# Patient Record
Sex: Female | Born: 1960 | Race: White | Hispanic: No | Smoking: Former smoker
Health system: Southern US, Community
[De-identification: ages and names within clinical notes are randomized; demographics above are authoritative.]

## PROBLEM LIST (undated history)

## (undated) DIAGNOSIS — M1990 Unspecified osteoarthritis, unspecified site: Secondary | ICD-10-CM

## (undated) DIAGNOSIS — F319 Bipolar disorder, unspecified: Secondary | ICD-10-CM

## (undated) DIAGNOSIS — K219 Gastro-esophageal reflux disease without esophagitis: Secondary | ICD-10-CM

## (undated) DIAGNOSIS — Z87442 Personal history of urinary calculi: Secondary | ICD-10-CM

## (undated) DIAGNOSIS — F32A Depression, unspecified: Secondary | ICD-10-CM

## (undated) DIAGNOSIS — T7840XA Allergy, unspecified, initial encounter: Secondary | ICD-10-CM

## (undated) DIAGNOSIS — D689 Coagulation defect, unspecified: Secondary | ICD-10-CM

## (undated) DIAGNOSIS — F329 Major depressive disorder, single episode, unspecified: Secondary | ICD-10-CM

## (undated) DIAGNOSIS — J449 Chronic obstructive pulmonary disease, unspecified: Secondary | ICD-10-CM

## (undated) DIAGNOSIS — D649 Anemia, unspecified: Secondary | ICD-10-CM

## (undated) DIAGNOSIS — F419 Anxiety disorder, unspecified: Secondary | ICD-10-CM

## (undated) HISTORY — DX: Gastro-esophageal reflux disease without esophagitis: K21.9

## (undated) HISTORY — DX: Unspecified osteoarthritis, unspecified site: M19.90

## (undated) HISTORY — DX: Anxiety disorder, unspecified: F41.9

## (undated) HISTORY — DX: Major depressive disorder, single episode, unspecified: F32.9

## (undated) HISTORY — DX: Anemia, unspecified: D64.9

## (undated) HISTORY — DX: Allergy, unspecified, initial encounter: T78.40XA

## (undated) HISTORY — PX: HAND SURGERY: SHX662

## (undated) HISTORY — DX: Bipolar disorder, unspecified: F31.9

## (undated) HISTORY — DX: Coagulation defect, unspecified: D68.9

## (undated) HISTORY — DX: Depression, unspecified: F32.A

## (undated) HISTORY — DX: Chronic obstructive pulmonary disease, unspecified: J44.9

---

## 2000-08-29 ENCOUNTER — Encounter: Admission: RE | Admit: 2000-08-29 | Discharge: 2000-08-29 | Payer: Self-pay | Admitting: Occupational Medicine

## 2000-08-29 ENCOUNTER — Encounter: Payer: Self-pay | Admitting: Occupational Medicine

## 2003-08-29 HISTORY — PX: CHOLECYSTECTOMY: SHX55

## 2004-08-09 ENCOUNTER — Emergency Department (HOSPITAL_COMMUNITY): Admission: EM | Admit: 2004-08-09 | Discharge: 2004-08-09 | Payer: Self-pay | Admitting: *Deleted

## 2008-09-01 ENCOUNTER — Inpatient Hospital Stay (HOSPITAL_COMMUNITY): Admission: EM | Admit: 2008-09-01 | Discharge: 2008-09-03 | Payer: Self-pay | Admitting: Emergency Medicine

## 2008-09-01 ENCOUNTER — Ambulatory Visit: Payer: Self-pay | Admitting: Cardiovascular Disease

## 2008-09-02 ENCOUNTER — Encounter (INDEPENDENT_AMBULATORY_CARE_PROVIDER_SITE_OTHER): Payer: Self-pay | Admitting: Internal Medicine

## 2010-07-08 ENCOUNTER — Emergency Department (HOSPITAL_COMMUNITY)
Admission: EM | Admit: 2010-07-08 | Discharge: 2010-07-08 | Payer: Self-pay | Source: Home / Self Care | Admitting: Emergency Medicine

## 2010-07-25 ENCOUNTER — Encounter
Admission: RE | Admit: 2010-07-25 | Discharge: 2010-08-25 | Payer: Self-pay | Source: Home / Self Care | Attending: Orthopaedic Surgery | Admitting: Orthopaedic Surgery

## 2010-08-28 ENCOUNTER — Encounter: Admission: RE | Admit: 2010-08-28 | Payer: Self-pay | Source: Home / Self Care | Admitting: Orthopaedic Surgery

## 2010-12-12 LAB — CBC
HCT: 32.1 % — ABNORMAL LOW (ref 36.0–46.0)
HCT: 34.7 % — ABNORMAL LOW (ref 36.0–46.0)
MCV: 95.3 fL (ref 78.0–100.0)
Platelets: 174 10*3/uL (ref 150–400)
Platelets: 235 10*3/uL (ref 150–400)
Platelets: 237 10*3/uL (ref 150–400)
RDW: 11.7 % (ref 11.5–15.5)
RDW: 11.9 % (ref 11.5–15.5)
WBC: 6.6 10*3/uL (ref 4.0–10.5)
WBC: 7.4 10*3/uL (ref 4.0–10.5)

## 2010-12-12 LAB — DIFFERENTIAL
Basophils Absolute: 0.1 10*3/uL (ref 0.0–0.1)
Lymphocytes Relative: 26 % (ref 12–46)
Lymphs Abs: 1.9 10*3/uL (ref 0.7–4.0)
Monocytes Absolute: 0.4 10*3/uL (ref 0.1–1.0)
Monocytes Relative: 6 % (ref 3–12)
Neutro Abs: 4.9 10*3/uL (ref 1.7–7.7)

## 2010-12-12 LAB — POCT I-STAT, CHEM 8
BUN: 8 mg/dL (ref 6–23)
Calcium, Ion: 1.19 mmol/L (ref 1.12–1.32)
Chloride: 103 mEq/L (ref 96–112)
Creatinine, Ser: 0.9 mg/dL (ref 0.4–1.2)
Glucose, Bld: 98 mg/dL (ref 70–99)
HCT: 39 % (ref 36.0–46.0)
Hemoglobin: 13.3 g/dL (ref 12.0–15.0)
Potassium: 4.3 mEq/L (ref 3.5–5.1)
Sodium: 142 mEq/L (ref 135–145)
TCO2: 29 mmol/L (ref 0–100)

## 2010-12-12 LAB — TROPONIN I: Troponin I: <0.01 ng/mL (ref 0.00–0.06)

## 2010-12-12 LAB — BASIC METABOLIC PANEL
BUN: 4 mg/dL — ABNORMAL LOW (ref 6–23)
BUN: 8 mg/dL (ref 6–23)
CO2: 29 mEq/L (ref 19–32)
Chloride: 108 mEq/L (ref 96–112)
Chloride: 109 mEq/L (ref 96–112)
Glucose, Bld: 79 mg/dL (ref 70–99)
Potassium: 3.6 mEq/L (ref 3.5–5.1)
Potassium: 4 mEq/L (ref 3.5–5.1)

## 2010-12-12 LAB — CARDIAC PANEL(CRET KIN+CKTOT+MB+TROPI)
CK, MB: 0.4 ng/mL (ref 0.3–4.0)
CK, MB: 0.5 ng/mL (ref 0.3–4.0)
Relative Index: INVALID (ref 0.0–2.5)
Total CK: 38 U/L (ref 7–177)
Troponin I: <0.01 ng/mL (ref 0.00–0.06)
Troponin I: <0.01 ng/mL (ref 0.00–0.06)

## 2010-12-12 LAB — TSH: TSH: 0.652 u[IU]/mL (ref 0.350–4.500)

## 2010-12-12 LAB — RAPID URINE DRUG SCREEN, HOSP PERFORMED
Amphetamines: NOT DETECTED
Barbiturates: NOT DETECTED
Benzodiazepines: POSITIVE — AB
Cocaine: NOT DETECTED
Opiates: NOT DETECTED
Tetrahydrocannabinol: NOT DETECTED

## 2010-12-12 LAB — BRAIN NATRIURETIC PEPTIDE: Pro B Natriuretic peptide (BNP): <30 pg/mL (ref 0.0–100.0)

## 2010-12-12 LAB — COMPREHENSIVE METABOLIC PANEL
Albumin: 3.9 g/dL (ref 3.5–5.2)
BUN: 6 mg/dL (ref 6–23)
Calcium: 9 mg/dL (ref 8.4–10.5)
Creatinine, Ser: 0.67 mg/dL (ref 0.4–1.2)
GFR calc Af Amer: >60 mL/min (ref >60)
Total Bilirubin: 0.9 mg/dL (ref 0.3–1.2)
Total Protein: 6.7 g/dL (ref 6.0–8.3)

## 2010-12-12 LAB — CK TOTAL AND CKMB (NOT AT ARMC)
CK, MB: 0.5 ng/mL (ref 0.3–4.0)
Relative Index: INVALID (ref 0.0–2.5)
Total CK: 54 U/L (ref 7–177)

## 2010-12-12 LAB — POCT CARDIAC MARKERS
CKMB, poc: <1 ng/mL — ABNORMAL LOW (ref 1.0–8.0)
Myoglobin, poc: 26 ng/mL (ref 12–200)
Troponin i, poc: <0.05 ng/mL (ref 0.00–0.09)

## 2010-12-12 LAB — LIPID PANEL
LDL Cholesterol: 127 mg/dL — ABNORMAL HIGH (ref 0–99)
VLDL: 14 mg/dL (ref 0–40)

## 2011-01-10 NOTE — H&P (Signed)
NAMEBeaulah Delacruz                ACCOUNT NO.:  [id number]   MEDICAL RECORD NO.:  [id number]          PATIENT TYPE:  EMS   LOCATION:  MAJO                         FACILITY:  [facility]   PHYSICIAN:  Lonia Blood, M.D.      DATE OF BIRTH:  [birth year]   DATE OF ADMISSION:  09/01/2008  DATE OF DISCHARGE:                              HISTORY & PHYSICAL   PRIMARY CARE PHYSICIAN:  The patient is unassigned.   PRESENTING COMPLAINT:  Left-sided chest pain.   HISTORY OF PRESENT ILLNESS:  The patient is a 50 year old white female  with history of tobacco abuse, but otherwise no significant past medical  history, who apparently started experiencing sharp chest pain today.  The pain is mainly located in the left chest associated with pain in the  roof of her mouth.  She has some intermittent numbness.  The patient has  been unable to concentrate and has multiple complaints with pain all  over the place, but they have now settled mainly in the left chest.  No  radiation.  This was rated as 6/10 and intermittently.  No relationship  to exertion.  She denied any nausea, vomiting, no diaphoresis.  Her past  medical history is not significant.   MEDICATIONS:  None.   ALLERGIES:  No known drug allergies.   SOCIAL HISTORY:  [place]  She  smokes sparingly, but less than a pack per day.  Occasional alcohol, but  no IV drug use.   FAMILY HISTORY:  Significant for coronary artery disease on both sides  of her parents.  Both her parents died from heart disease.  One of her  brothers had heart surgery in his 75s.   REVIEW OF SYSTEMS:  A 12-point review of systems is negative except per  HPI.   PHYSICAL EXAMINATION:  VITAL SIGNS:  Temperature 98.6, blood pressure  133/88, pulse 90, respiratory rate 18, and sats 100% on room air.  GENERAL:  She is awake, alert, oriented, in no acute distress.  HEENT:  PERRL.  EOMI.  NECK:  Supple.  No JVD, no lymphadenopathy.  RESPIRATORY:  She has good air entry bilaterally.  No wheezes, no rales.  CARDIOVASCULAR:  The patient has S1 and S2.  No murmur.  ABDOMEN:  Soft and nontender with positive bowel sounds.  EXTREMITIES:  No edema, cyanosis, or clubbing.   LABORATORY DATA:  Sodium 142, potassium 4.3, chloride 103, BUN 8,  creatinine 0.9, and glucose 98.  Chest x-ray showed minimal linear  atelectasis or scar in the right middle lobe, but no acute  cardiopulmonary disease.  EKG showed normal sinus rhythm with no  significant ST-T wave changes.   ASSESSMENT:  This is a 50 year old female with significant family  history for coronary artery disease as well as tobacco abuse, presenting  with left-sided chest pain as well as pain in the roof of her mouth.   PLAN:  1. Chest pain.  We will admit the patient to tele bed for rule out MI.      We will check serial cardiac enzymes.  The patient has significant      risk factor that if enzymes are negative, she will benefit from      some stress test for risk stratification.  2. Tobacco abuse.  The patient has been counseled.  She apparently      started smoking only a year ago.  She knows the danger of continued      to smoke.  Further treatment will depend on the patient's response      to initial measures.      Lonia Blood, M.D.  Electronically Signed     LG/MEDQ  D:  09/01/2008  T:  09/02/2008  Job:  [id number]

## 2011-01-10 NOTE — Discharge Summary (Signed)
NAMEBeaulah Delacruz                ACCOUNT NO.:  [id number]   MEDICAL RECORD NO.:  [id number]          PATIENT TYPE:  INP   LOCATION:  6707                         FACILITY:  [facility]   PHYSICIAN:  Beckey Rutter, MD  DATE OF BIRTH:  1961/07/12   DATE OF ADMISSION:  09/01/2008  DATE OF DISCHARGE:  09/03/2008                               DISCHARGE SUMMARY   PRIMARY CARE PHYSICIAN:  Unassigned.   CHIEF COMPLAINT:  Left-sided chest pain.   HOSPITAL COURSE:  During hospital stay, this very pleasant 50 year old  Caucasian lady was ruled out for acute coronary syndrome by serial  cardiac enzymes and serial EKG tracings.  The patient also was kept in  telemetry setting with no activities during the 24-hour  observation/monitoring.  The patient's EKGs were normal with ventricular  rate around 80.  The patient denied any chest pain while in the  hospital.  She was concerned about pain on her upper/hard palate, which  was improved.  The patient is stable for discharge today, although she  was advised to follow up with her primary physician to further evaluate  her condition as needed.  She is aware and agreeable to this plan.   Tobacco abuse:  The patient recently started to smoke cigarettes but I  had a lengthy discussion with her to quit smoking, and she was counseled  and all possible hope was offered.   DISCHARGE DIAGNOSES:  1. Chest pain, atypical, noncardiogenic in origin, the patient ruled      out.  2. Tobacco abuse.   DISCHARGE MEDICATION:  Aspirin 81 mg p.o. daily.   HOSPITAL PROCEDURES AND TESTS:  White blood count is 5.4, hemoglobin is  11.0, hematocrit 32.1, platelets 174.  Sodium 143, potassium 4.0,  chloride 109, bicarbonate is 29, BUN is 4, and creatinine 0.74.  TSH is  0.652.   DISCHARGE PLAN:  The patient is discharged today to follow up with her  primary.  The patient does not have primary physician, and I had a  lengthy discussion with her to the importance of  follow up with primary  for primary care needs and for possible further stratification, which  was not needed during this hospital stay.  We will have the clinical  social worker to evaluate the patient and provide possible phone numbers  and possible leads to find primary physician in her [place].  Again, she  is aware and agreeable to discharge plan.      Beckey Rutter, MD  Electronically Signed     EME/MEDQ  D:  09/03/2008  T:  09/04/2008  Job:  [id number]

## 2011-06-27 ENCOUNTER — Ambulatory Visit: Payer: Self-pay | Admitting: Physical Therapy

## 2013-05-06 ENCOUNTER — Emergency Department (HOSPITAL_COMMUNITY)
Admission: EM | Admit: 2013-05-06 | Discharge: 2013-05-06 | Disposition: A | Discharge status: Home / Self Care | Payer: Self-pay | Attending: Emergency Medicine | Admitting: Emergency Medicine

## 2013-05-06 ENCOUNTER — Encounter (HOSPITAL_COMMUNITY): Payer: Self-pay | Admitting: *Deleted

## 2013-05-06 DIAGNOSIS — F17200 Nicotine dependence, unspecified, uncomplicated: Secondary | ICD-10-CM | POA: Insufficient documentation

## 2013-05-06 DIAGNOSIS — L240 Irritant contact dermatitis due to detergents: Secondary | ICD-10-CM | POA: Insufficient documentation

## 2013-05-06 DIAGNOSIS — L309 Dermatitis, unspecified: Secondary | ICD-10-CM

## 2013-05-06 MED ORDER — PREDNISONE 10 MG PO TABS: ORAL_TABLET | ORAL | Status: DC

## 2013-05-06 MED ORDER — DEXAMETHASONE SODIUM PHOSPHATE 10 MG/ML IJ SOLN
10.0000 mg | Freq: Once | INTRAMUSCULAR | Status: AC
  Administered 2013-05-06: 10 mg via INTRAMUSCULAR
  Filled 2013-05-06: qty 1

## 2013-05-06 NOTE — ED Notes (Signed)
Pt c/o rash and itching since Sunday, has tried benadryl which seemed to help with the itching but makes pt sleepy. Denies any sob, problems swallowing,

## 2013-05-06 NOTE — ED Notes (Signed)
Itching rash to trunk, arms, and thighs.  Nasal congestion.

## 2013-05-09 NOTE — ED Provider Notes (Signed)
CSN: [id number]     Arrival date & time 05/06/13  1413 History   First MD Initiated Contact with Patient 05/06/13 1504     Chief Complaint  Patient presents with  . Rash  . Pruritis   (Consider location/radiation/quality/duration/timing/severity/associated sxs/prior Treatment) Patient is a 52 y.o. female presenting with rash. The history is provided by the patient.  Rash Location:  Torso and shoulder/arm Shoulder/arm rash location:  L arm and R arm Torso rash location:  Upper back and lower back Quality: itchiness and redness   Quality: not blistering, not bruising, not burning, not draining, not painful, not scaling and not weeping   Severity:  Moderate Onset quality:  Gradual Duration:  2 days Timing:  Constant Progression:  Worsening Chronicity:  New Context: new detergent/soap   Context: not chemical exposure, not exposure to similar rash, not food, not insect bite/sting, not medications and not sick contacts   Relieved by:  Antihistamines Worsened by:  Nothing tried Ineffective treatments:  None tried Associated symptoms: no fever, no hoarse voice, no nausea, no shortness of breath, no throat swelling, no tongue swelling and not wheezing     History reviewed. No pertinent past medical history. History reviewed. No pertinent past surgical history. No family history on file. History  Substance Use Topics  . Smoking status: Current Every Day Smoker  . Smokeless tobacco: Not on file  . Alcohol Use: Not on file   OB History   Grav Para Term Preterm Abortions TAB SAB Ect Mult Living                 Review of Systems  Constitutional: Negative for fever.  HENT: Negative for hoarse voice.   Respiratory: Negative for shortness of breath and wheezing.   Gastrointestinal: Negative for nausea.  Skin: Positive for rash.    Allergies  Review of patient's allergies indicates no known allergies.  Home Medications   Current Outpatient Rx  Name  Route  Sig  Dispense  Refill   . predniSONE (DELTASONE) 10 MG tablet      6, 5, 4, 3, 2 then 1 tablet by mouth daily for 6 days total.   21 tablet   0    BP 123/65  Pulse 89  Temp(Src) 98.4 F (36.9 C) (Oral)  Resp 20  Ht 5\' 6"  (1.676 m)  Wt 128 lb (58.06 kg)  BMI 20.67 kg/m2  SpO2 100% Physical Exam  Constitutional: She appears well-developed and well-nourished. No distress.  HENT:  Head: Normocephalic.  Neck: Neck supple.  Cardiovascular: Normal rate.   Pulmonary/Chest: Effort normal. She has no wheezes.  Musculoskeletal: Normal range of motion. She exhibits no edema.  Skin: Rash noted. Rash is papular.  Small scattered papules, majority on lower back,  Few and abdomen and forearms. Approx 3 mm, nontender,  No drainage, no surrounding erythyma.  Not in intertriginous spaces.    ED Course  Procedures (including critical care time) Labs Review Labs Reviewed - No data to display Imaging Review No results found.  MDM   1. Dermatitis    Prednisone taper, encouraged continued benadryl for itching.  Suspect contact dermatitis from exposure to new laundry detergent.    Burgess Amor, PA-C 05/09/13 2242

## 2013-05-12 NOTE — ED Provider Notes (Signed)
Medical screening examination/treatment/procedure(s) were performed by non-physician practitioner and as supervising physician I was immediately available for consultation/collaboration.   Kye Hedden L Kamari Buch, MD 05/12/13 1324 

## 2015-09-15 ENCOUNTER — Emergency Department (HOSPITAL_COMMUNITY): Payer: Self-pay

## 2015-09-15 ENCOUNTER — Encounter (HOSPITAL_COMMUNITY): Payer: Self-pay

## 2015-09-15 ENCOUNTER — Emergency Department (HOSPITAL_COMMUNITY)
Admission: EM | Admit: 2015-09-15 | Discharge: 2015-09-15 | Disposition: A | Discharge status: Home / Self Care | Payer: Self-pay | Attending: Emergency Medicine | Admitting: Emergency Medicine

## 2015-09-15 DIAGNOSIS — F17210 Nicotine dependence, cigarettes, uncomplicated: Secondary | ICD-10-CM | POA: Insufficient documentation

## 2015-09-15 DIAGNOSIS — S50311A Abrasion of right elbow, initial encounter: Secondary | ICD-10-CM | POA: Insufficient documentation

## 2015-09-15 DIAGNOSIS — S40011A Contusion of right shoulder, initial encounter: Secondary | ICD-10-CM | POA: Insufficient documentation

## 2015-09-15 DIAGNOSIS — Y998 Other external cause status: Secondary | ICD-10-CM | POA: Insufficient documentation

## 2015-09-15 DIAGNOSIS — S0081XA Abrasion of other part of head, initial encounter: Secondary | ICD-10-CM | POA: Insufficient documentation

## 2015-09-15 DIAGNOSIS — W108XXA Fall (on) (from) other stairs and steps, initial encounter: Secondary | ICD-10-CM | POA: Insufficient documentation

## 2015-09-15 DIAGNOSIS — S43401A Unspecified sprain of right shoulder joint, initial encounter: Secondary | ICD-10-CM | POA: Insufficient documentation

## 2015-09-15 DIAGNOSIS — Y9389 Activity, other specified: Secondary | ICD-10-CM | POA: Insufficient documentation

## 2015-09-15 DIAGNOSIS — Y9289 Other specified places as the place of occurrence of the external cause: Secondary | ICD-10-CM | POA: Insufficient documentation

## 2015-09-15 MED ORDER — ACETAMINOPHEN-CODEINE #3 300-30 MG PO TABS
2.0000 | ORAL_TABLET | Freq: Once | ORAL | Status: AC
  Administered 2015-09-15: 2 via ORAL
  Filled 2015-09-15: qty 2

## 2015-09-15 MED ORDER — IBUPROFEN 800 MG PO TABS
800.0000 mg | ORAL_TABLET | Freq: Once | ORAL | Status: AC
  Administered 2015-09-15: 800 mg via ORAL
  Filled 2015-09-15: qty 1

## 2015-09-15 MED ORDER — ACETAMINOPHEN-CODEINE #3 300-30 MG PO TABS: 1.0000 | ORAL_TABLET | Freq: Four times a day (QID) | ORAL | Status: DC | PRN

## 2015-09-15 MED ORDER — ONDANSETRON HCL 4 MG PO TABS
4.0000 mg | ORAL_TABLET | Freq: Once | ORAL | Status: AC
  Administered 2015-09-15: 4 mg via ORAL
  Filled 2015-09-15: qty 1

## 2015-09-15 MED ORDER — IBUPROFEN 800 MG PO TABS: 800.0000 mg | ORAL_TABLET | Freq: Three times a day (TID) | ORAL | Status: DC

## 2015-09-15 MED ORDER — IBUPROFEN 600 MG PO TABS: 600.0000 mg | ORAL_TABLET | Freq: Four times a day (QID) | ORAL | Status: DC | PRN

## 2015-09-15 MED ORDER — PROMETHAZINE HCL 25 MG PO TABS: 25.0000 mg | ORAL_TABLET | Freq: Four times a day (QID) | ORAL | Status: DC | PRN

## 2015-09-15 MED ORDER — BACITRACIN-NEOMYCIN-POLYMYXIN 400-5-5000 EX OINT
TOPICAL_OINTMENT | Freq: Once | CUTANEOUS | Status: AC
  Administered 2015-09-15: 1 via TOPICAL
  Filled 2015-09-15: qty 1

## 2015-09-15 NOTE — ED Provider Notes (Signed)
CSN: [place]:[id number]     Arrival date & time 09/15/15  1746 History   First MD Initiated Contact with Patient 09/15/15 1821     Chief Complaint  Patient presents with  . Shoulder Injury     (Consider location/radiation/quality/duration/timing/severity/associated sxs/prior Treatment) HPI Comments: Patient is a 55 year old female who presents to the emergency department with right shoulder pain.  The patient states that on last evening she fell down approximately 4 or 5 steps. She sustained injury to the right shoulder, and abrasion to the elbow. She states she had a significant amount of pain on last evening. She took [employer], and apply a heating pad, but did not get any relief. Today she asked family to bring her to the emergency department for evaluation. The patient has not had any previous operations or procedures involving the right shoulder. The patient denies being on any anticoagulation medications, and there is no history of any bleeding disorders. The patient reports possible abrasion of her right knee, and right elbow, but no other injuries are reported at this time.  Patient is a 55 y.o. female presenting with shoulder injury. The history is provided by the patient.  Shoulder Injury This is a new problem. Associated symptoms include arthralgias.    History reviewed. No pertinent past medical history. History reviewed. No pertinent past surgical history. No family history on file. Social History  Substance Use Topics  . Smoking status: Current Every Day Smoker -- 1.00 packs/day    Types: Cigarettes  . Smokeless tobacco: None  . Alcohol Use: No   OB History    No data available     Review of Systems  Musculoskeletal: Positive for arthralgias.  Skin:       Abrasions  All other systems reviewed and are negative.     Allergies  Keflex  Home Medications   Prior to Admission medications   Medication Sig Start Date End Date Taking? Authorizing Provider    acetaminophen-codeine (TYLENOL #3) 300-30 MG tablet Take 1-2 tablets by mouth every 6 (six) hours as needed for moderate pain. 09/15/15   Lily Kocher, PA-C  ibuprofen (ADVIL,MOTRIN) 600 MG tablet Take 1 tablet (600 mg total) by mouth every 6 (six) hours as needed. 09/15/15   Lily Kocher, PA-C  predniSONE (DELTASONE) 10 MG tablet 6, 5, 4, 3, 2 then 1 tablet by mouth daily for 6 days total. 05/06/13   Evalee Jefferson, PA-C   BP 157/84 mmHg  Pulse 100  Temp(Src) 97.6 F (36.4 C) (Oral)  Resp 16  Ht 5\' 5"  (1.651 m)  Wt 61.236 kg  BMI 22.47 kg/m2  SpO2 100% Physical Exam  Constitutional: She is oriented to person, place, and time. She appears well-developed and well-nourished.  Non-toxic appearance.  HENT:  Head: Normocephalic. Head is with abrasion.    Right Ear: Tympanic membrane and external ear normal.  Left Ear: Tympanic membrane and external ear normal.  Eyes: EOM and lids are normal. Pupils are equal, round, and reactive to light.  Neck: Normal range of motion. Neck supple. Carotid bruit is not present.  Cardiovascular: Normal rate, regular rhythm, normal heart sounds, intact distal pulses and normal pulses.   Pulmonary/Chest: Breath sounds normal. No respiratory distress.  Abdominal: Soft. Bowel sounds are normal. There is no tenderness. There is no guarding.  Musculoskeletal:       Right shoulder: She exhibits decreased range of motion, tenderness, pain and spasm. She exhibits no effusion and no deformity.  Lymphadenopathy:  Head (right side): No submandibular adenopathy present.       Head (left side): No submandibular adenopathy present.    She has no cervical adenopathy.  Neurological: She is alert and oriented to person, place, and time. She has normal strength. No cranial nerve deficit or sensory deficit.  Skin: Skin is warm and dry. Abrasion noted.     Abrasion of the right elbow noted.  Psychiatric: She has a normal mood and affect. Her speech is normal.  Nursing  note and vitals reviewed.   ED Course  Procedures (including critical care time) Labs Review Labs Reviewed - No data to display  Imaging Review Dg Shoulder Right  09/15/2015  CLINICAL DATA:  Fall down stairs last night with right shoulder pain. EXAM: RIGHT SHOULDER - 2+ VIEW COMPARISON:  None. FINDINGS: No acute fracture or dislocation identified. There are degenerative changes involving the acromion as well as the lateral humeral head. Soft tissues are unremarkable. No bony lesions are seen. IMPRESSION: No acute fracture or dislocation identified. Electronically Signed   By: Aletta Edouard M.D.   On: 09/15/2015 18:43   I have personally reviewed and evaluated these images and lab results as part of my medical decision-making.   EKG Interpretation None      MDM  Vital signs well within normal limits. Patient speaks in complete sentences without problem. Pulse oximetry is 100% on room air. There is pain to palpation and attempted range of motion of right shoulder. The x-ray of the right shoulder is negative for fracture or dislocation. The patient is placed in a shoulder immobilizer, and is treated with Tylenol codeine and ibuprofen. The patient is referred to orthopedics for additional evaluation and management of the right shoulder pain. The patient has an abrasion of the right elbow, and this was treated with Neosporin at this time.    Final diagnoses:  Contusion of right shoulder, initial encounter  Shoulder sprain, right, initial encounter    **I have reviewed nursing notes, vital signs, and all appropriate lab and imaging results for this patient.*    Lily Kocher, PA-C 09/15/15 [place], PA-C 09/15/15 1930  Lily Kocher, PA-C 09/15/15 1947  Milton Ferguson, MD 09/17/15 [phone]

## 2015-09-15 NOTE — Discharge Instructions (Signed)
The x-ray of your shoulder reveals some arthritis changes, but no fracture, and no dislocation. Please use the shoulder immobilizer for the next 5-7 days. Use ibuprofen every 6 hours with food. Use Tylenol codeine for more severe pain. Tylenol codeine may cause drowsiness, please do not drink alcohol, drive a vehicle, operating machinery, or pertussis. Activities requiring concentration when taking this medication. Please see the orthopedic specialist as sone as possible for additional evaluation and management of your shoulder pain. Shoulder Sprain A shoulder sprain is a partial or complete tear in one of the tough, fiber-like tissues (ligaments) in the shoulder. The ligaments in the shoulder help to hold the shoulder in place. CAUSES This condition may be caused by:  A fall.  A hit to the shoulder.  A twist of the arm. RISK FACTORS This condition is more likely to develop in:  People who play sports.  People who have problems with balance or coordination. SYMPTOMS Symptoms of this condition include:  Pain when moving the shoulder.  Limited ability to move the shoulder.  Swelling and tenderness on top of the shoulder.  Warmth in the shoulder.  A change in the shape of the shoulder.  Redness or bruising on the shoulder. DIAGNOSIS This condition is diagnosed with a physical exam. During the exam, you may be asked to do simple exercises with your shoulder. You may also have imaging tests, such as X-rays, MRI, or a CT scan. These tests can show how severe the sprain is. TREATMENT This condition may be treated with:  Rest.  Pain medicine.  Ice.  A sling or brace. This is used to keep the arm still while the shoulder is healing.  Physical therapy or rehabilitation exercises. These help to improve the range of motion and strength of the shoulder.  Surgery (rare). Surgery may be needed if the sprain caused a joint to become unstable. Surgery may also be needed to reduce  pain. Some people may develop ongoing shoulder pain or lose some range of motion in the shoulder. However, most people do not develop long-term problems. HOME CARE INSTRUCTIONS  Rest.  Take over-the-counter and prescription medicines only as told by your health care provider.  If directed, apply ice to the area:  Put ice in a plastic bag.  Place a towel between your skin and the bag.  Leave the ice on for 20 minutes, 2-3 times per day.  If you were given a shoulder sling or brace:  Wear it as told.  Remove it to shower or bathe.  Move your arm only as much as told by your health care provider, but keep your hand moving to prevent swelling.  If you were shown how to do any exercises, do them as told by your health care provider.  Keep all follow-up visits as told by your health care provider. This is important. SEEK MEDICAL CARE IF:  Your pain gets worse.  Your pain is not relieved with medicines.  You have increased redness or swelling. SEEK IMMEDIATE MEDICAL CARE IF:  You have a fever.  You cannot move your arm or shoulder.  You develop numbness or tingling in your arms, hands, or fingers.   This information is not intended to replace advice given to you by your health care provider. Make sure you discuss any questions you have with your health care provider.   Document Released: 12/31/2008 Document Revised: 05/05/2015 Document Reviewed: 12/07/2014 Elsevier Interactive Patient Education [employer].

## 2015-09-15 NOTE — ED Notes (Signed)
Patient states she slipped and fell down approx. 4-5 steps last night. C/o right shoulder pain. Denies LOC. Denies neck/back pain.

## 2016-08-01 ENCOUNTER — Encounter: Payer: Self-pay | Admitting: Physician Assistant

## 2016-08-01 ENCOUNTER — Ambulatory Visit (INDEPENDENT_AMBULATORY_CARE_PROVIDER_SITE_OTHER): Payer: Medicaid Other | Admitting: Physician Assistant

## 2016-08-01 VITALS — BP 134/82 | HR 86 | Temp 97.2°F | Ht 65.0 in | Wt 169.0 lb

## 2016-08-01 DIAGNOSIS — Z0000 Encounter for general adult medical examination without abnormal findings: Secondary | ICD-10-CM

## 2016-08-01 DIAGNOSIS — R630 Anorexia: Secondary | ICD-10-CM | POA: Diagnosis not present

## 2016-08-01 DIAGNOSIS — J441 Chronic obstructive pulmonary disease with (acute) exacerbation: Secondary | ICD-10-CM | POA: Diagnosis not present

## 2016-08-01 DIAGNOSIS — K2100 Gastro-esophageal reflux disease with esophagitis, without bleeding: Secondary | ICD-10-CM | POA: Insufficient documentation

## 2016-08-01 DIAGNOSIS — F331 Major depressive disorder, recurrent, moderate: Secondary | ICD-10-CM | POA: Diagnosis not present

## 2016-08-01 DIAGNOSIS — Z23 Encounter for immunization: Secondary | ICD-10-CM

## 2016-08-01 DIAGNOSIS — R197 Diarrhea, unspecified: Secondary | ICD-10-CM | POA: Diagnosis not present

## 2016-08-01 DIAGNOSIS — F411 Generalized anxiety disorder: Secondary | ICD-10-CM | POA: Insufficient documentation

## 2016-08-01 DIAGNOSIS — M7711 Lateral epicondylitis, right elbow: Secondary | ICD-10-CM | POA: Diagnosis not present

## 2016-08-01 DIAGNOSIS — M549 Dorsalgia, unspecified: Secondary | ICD-10-CM | POA: Diagnosis not present

## 2016-08-01 DIAGNOSIS — G8929 Other chronic pain: Secondary | ICD-10-CM

## 2016-08-01 DIAGNOSIS — J439 Emphysema, unspecified: Secondary | ICD-10-CM | POA: Insufficient documentation

## 2016-08-01 MED ORDER — MELOXICAM 7.5 MG PO TABS: 7.5000 mg | ORAL_TABLET | Freq: Every day | ORAL | 2 refills | Status: DC

## 2016-08-01 MED ORDER — BUSPIRONE HCL 10 MG PO TABS: 10.0000 mg | ORAL_TABLET | Freq: Three times a day (TID) | ORAL | Status: DC

## 2016-08-01 MED ORDER — PROMETHAZINE HCL 25 MG PO TABS: 25.0000 mg | ORAL_TABLET | Freq: Three times a day (TID) | ORAL | 0 refills | Status: DC | PRN

## 2016-08-01 MED ORDER — BUPROPION HCL ER (XL) 150 MG PO TB24: 150.0000 mg | ORAL_TABLET | Freq: Every day | ORAL | Status: DC

## 2016-08-01 MED ORDER — OMEPRAZOLE 20 MG PO CPDR: 20.0000 mg | DELAYED_RELEASE_CAPSULE | Freq: Every day | ORAL | 6 refills | Status: DC

## 2016-08-01 NOTE — Patient Instructions (Signed)
Food Choices for Gastroesophageal Reflux Disease, Adult When you have gastroesophageal reflux disease (GERD), the foods you eat and your eating habits are very important. Choosing the right foods can help ease your discomfort. What guidelines do I need to follow?  Choose fruits, vegetables, whole grains, and low-fat dairy products.  Choose low-fat meat, fish, and poultry.  Limit fats such as oils, salad dressings, butter, nuts, and avocado.  Keep a food diary. This helps you identify foods that cause symptoms.  Avoid foods that cause symptoms. These may be different for everyone.  Eat small meals often instead of 3 large meals a day.  Eat your meals slowly, in a place where you are relaxed.  Limit fried foods.  Cook foods using methods other than frying.  Avoid drinking alcohol.  Avoid drinking large amounts of liquids with your meals.  Avoid bending over or lying down until 2-3 hours after eating. What foods are not recommended? These are some foods and drinks that may make your symptoms worse: Vegetables  Tomatoes. Tomato juice. Tomato and spaghetti sauce. Chili peppers. Onion and garlic. Horseradish. Fruits  Oranges, grapefruit, and lemon (fruit and juice). Meats  High-fat meats, fish, and poultry. This includes hot dogs, ribs, ham, sausage, salami, and bacon. Dairy  Whole milk and chocolate milk. Sour cream. Cream. Butter. Ice cream. Cream cheese. Drinks  Coffee and tea. Bubbly (carbonated) drinks or energy drinks. Condiments  Hot sauce. Barbecue sauce. Sweets/Desserts  Chocolate and cocoa. Donuts. Peppermint and spearmint. Fats and Oils  High-fat foods. This includes French fries and potato chips. Other  Vinegar. Strong spices. This includes black pepper, white pepper, red pepper, cayenne, curry powder, cloves, ginger, and chili powder. The items listed above may not be a complete list of foods and drinks to avoid. Contact your dietitian for more information.    This information is not intended to replace advice given to you by your health care provider. Make sure you discuss any questions you have with your health care provider. Document Released: 02/13/2012 Document Revised: 01/20/2016 Document Reviewed: 06/18/2013 Elsevier Interactive Patient Education  2017 [employer]  

## 2016-08-02 LAB — CMP14+EGFR
ALT: 14 IU/L (ref 0–32)
AST: 15 IU/L (ref 0–40)
Albumin/Globulin Ratio: 1.8 (ref 1.2–2.2)
Albumin: 4.4 g/dL (ref 3.5–5.5)
Alkaline Phosphatase: 76 IU/L (ref 39–117)
BUN/Creatinine Ratio: 11 (ref 9–23)
BUN: 10 mg/dL (ref 6–24)
Bilirubin Total: 0.5 mg/dL (ref 0.0–1.2)
CALCIUM: 9.4 mg/dL (ref 8.7–10.2)
CO2: 23 mmol/L (ref 18–29)
CREATININE: 0.88 mg/dL (ref 0.57–1.00)
Chloride: 105 mmol/L (ref 96–106)
GFR calc Af Amer: 85 mL/min/{1.73_m2} (ref >59)
GFR, EST NON AFRICAN AMERICAN: 74 mL/min/{1.73_m2} (ref >59)
GLOBULIN, TOTAL: 2.5 g/dL (ref 1.5–4.5)
Glucose: 87 mg/dL (ref 65–99)
Potassium: 4.5 mmol/L (ref 3.5–5.2)
Sodium: 145 mmol/L — ABNORMAL HIGH (ref 134–144)
Total Protein: 6.9 g/dL (ref 6.0–8.5)

## 2016-08-02 LAB — CBC WITH DIFFERENTIAL/PLATELET
BASOS: 1 %
Basophils Absolute: 0.1 10*3/uL (ref 0.0–0.2)
EOS (ABSOLUTE): 0.4 10*3/uL (ref 0.0–0.4)
EOS: 4 %
HEMATOCRIT: 39 % (ref 34.0–46.6)
Hemoglobin: 12.7 g/dL (ref 11.1–15.9)
Immature Grans (Abs): 0 10*3/uL (ref 0.0–0.1)
Immature Granulocytes: 0 %
LYMPHS ABS: 2.1 10*3/uL (ref 0.7–3.1)
Lymphs: 25 %
MCH: 30 pg (ref 26.6–33.0)
MCHC: 32.6 g/dL (ref 31.5–35.7)
MCV: 92 fL (ref 79–97)
MONOCYTES: 7 %
Monocytes Absolute: 0.6 10*3/uL (ref 0.1–0.9)
Neutrophils Absolute: 5.4 10*3/uL (ref 1.4–7.0)
Neutrophils: 63 %
PLATELETS: 291 10*3/uL (ref 150–379)
RBC: 4.23 x10E6/uL (ref 3.77–5.28)
RDW: 14.1 % (ref 12.3–15.4)
WBC: 8.6 10*3/uL (ref 3.4–10.8)

## 2016-08-02 LAB — LIPID PANEL
CHOL/HDL RATIO: 4.3 ratio (ref 0.0–4.4)
Cholesterol, Total: 259 mg/dL — ABNORMAL HIGH (ref 100–199)
HDL: 60 mg/dL (ref >39)
LDL CALC: 166 mg/dL — AB (ref 0–99)
TRIGLYCERIDES: 165 mg/dL — AB (ref 0–149)
VLDL CHOLESTEROL CAL: 33 mg/dL (ref 5–40)
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
## 2016-08-14 ENCOUNTER — Telehealth: Payer: Self-pay | Admitting: Orthopedic Surgery

## 2016-08-14 ENCOUNTER — Encounter: Payer: Self-pay | Admitting: Gastroenterology
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
## 2016-08-15 NOTE — Telephone Encounter (Signed)
Called back to patient to offer appointment, notes in Referral Workqueue.

## 2016-08-15 NOTE — Telephone Encounter (Signed)
Jan 17

## 2016-09-01 ENCOUNTER — Telehealth: Payer: Self-pay | Admitting: Gastroenterology

## 2016-09-01 ENCOUNTER — Ambulatory Visit: Payer: Self-pay | Admitting: Gastroenterology

## 2016-09-01 ENCOUNTER — Encounter: Payer: Self-pay | Admitting: Gastroenterology

## 2016-09-01 NOTE — Telephone Encounter (Signed)
PT WAS A NO SHOW AND LETTER SENT  °

## 2016-09-04 ENCOUNTER — Ambulatory Visit (INDEPENDENT_AMBULATORY_CARE_PROVIDER_SITE_OTHER): Payer: Medicaid Other | Admitting: Gastroenterology

## 2016-09-04 ENCOUNTER — Encounter: Payer: Self-pay | Admitting: Gastroenterology

## 2016-09-04 ENCOUNTER — Telehealth: Payer: Self-pay

## 2016-09-04 ENCOUNTER — Other Ambulatory Visit: Payer: Self-pay

## 2016-09-04 VITALS — BP 151/86 | HR 94 | Temp 97.6°F | Ht 66.0 in | Wt 169.0 lb

## 2016-09-04 DIAGNOSIS — R197 Diarrhea, unspecified: Secondary | ICD-10-CM | POA: Diagnosis not present

## 2016-09-04 DIAGNOSIS — K219 Gastro-esophageal reflux disease without esophagitis: Secondary | ICD-10-CM

## 2016-09-04 DIAGNOSIS — K2100 Gastro-esophageal reflux disease with esophagitis, without bleeding: Secondary | ICD-10-CM

## 2016-09-04 DIAGNOSIS — R1310 Dysphagia, unspecified: Secondary | ICD-10-CM

## 2016-09-04 DIAGNOSIS — R1319 Other dysphagia: Secondary | ICD-10-CM

## 2016-09-04 MED ORDER — SOD PICOSULFATE-MAG OX-CIT ACD 10-3.5-12 MG-GM-GM PO PACK: 1.0000 | PACK | ORAL | 0 refills | Status: DC

## 2016-09-04 NOTE — Assessment & Plan Note (Addendum)
56 y/o female with modest heartburn, complains of dysphagia to breads for several months. Symptoms intermittent. She is in process of having her teeth pulled but at this times feels she has been chewing adequately. She has went long period of time without healthcare, medication do to lack of insurance. Suspect she has esophageal web/ring/stricture. Offered EGD/ED in near future. Deep sedation given psychiatric disease/anxiety and h/o antidepressants (recently stopped her meds).  I have discussed the risks, alternatives, benefits with regards to but not limited to the risk of reaction to medication, bleeding, infection, perforation and the patient is agreeable to proceed. Written consent to be obtained.

## 2016-09-04 NOTE — Progress Notes (Signed)
Primary Care Physician:  Terald Sleeper, PA-C  Primary Gastroenterologist:  Barney Drain, MD   Chief Complaint  Patient presents with  . Diarrhea    HPI:  Nancy Delacruz is a 56 y.o. female here at the request of PCP for further evaluation of diarrhea and abdominal distention.    Patient has never had a colonoscopy. She complains of alternating solid and loose stool. has multiple stools daily.  Some abd cramping. No melena, brbpr. She is worried about the size of her abdomen. Feels like weight gain and distention is not explained by what she eats. Mostly drinks liquids, lot of sugar based ones. Complains of heartburn. Feels like food sticking in upper chest. Especially with bread. No N/V. Appetite so-so. She takes Goody's powders for headache. Recently started Mobic for elbow pain.  Recently given Prilosec RX by PCP but patient has not started.  Current Outpatient Prescriptions  Medication Sig Dispense Refill  . meloxicam (MOBIC) 7.5 MG tablet Take 1 tablet (7.5 mg total) by mouth daily. For inflammation 30 tablet 2  . omeprazole (PRILOSEC) 20 MG capsule Take 1 capsule (20 mg total) by mouth daily. 30 capsule 6   No current facility-administered medications for this visit.     Allergies as of 09/04/2016 - Review Complete 09/04/2016  Allergen Reaction Noted  . Keflex [cephalexin] Rash 09/15/2015    Past Medical History:  Diagnosis Date  . Anxiety   . Bipolar 1 disorder ([place])   . Depression     Past Surgical History:  Procedure Laterality Date  . CHOLECYSTECTOMY    . HAND SURGERY     left, tendon    Family History  Problem Relation Age of Onset  . Diabetes Mother   . Diabetes Father   . Cancer Brother     ?lung  . Cancer Brother   . Colon cancer Neg Hx     Social History   Social History  . Marital status: Divorced    Spouse name: N/A  . Number of children: N/A  . Years of education: N/A   Occupational History  . Not on file.   Social History Main Topics  .  Smoking status: Current Every Day Smoker    Packs/day: 1.00    Types: Cigarettes  . Smokeless tobacco: Former Systems developer  . Alcohol use No  . Drug use: No  . Sexual activity: Not on file   Other Topics Concern  . Not on file   Social History Narrative  . No narrative on file      ROS:  General: Negative for anorexia, weight loss, fever, chills, fatigue, weakness. Eyes: Negative for vision changes.  ENT: Negative for hoarseness,  nasal congestion. See hpi CV: Negative for chest pain, angina, palpitations, dyspnea on exertion, peripheral edema.  Respiratory: Negative for dyspnea at rest, dyspnea on exertion, cough, sputum, wheezing.  GI: See history of present illness. GU:  Negative for dysuria, hematuria, urinary incontinence, urinary frequency, nocturnal urination.  MS: Negative for low back pain. +elbow pain. Derm: Negative for rash or itching.  Neuro: Negative for weakness, abnormal sensation, seizure, frequent headaches, memory loss, confusion.  Psych: Negative for anxiety, depression, suicidal ideation, hallucinations.  Endo: Negative for unusual weight change.  Heme: Negative for bruising or bleeding. Allergy: Negative for rash or hives.    Physical Examination:  BP (!) 151/86   Pulse 94   Temp 97.6 F (36.4 C) (Oral)   Ht 5\' 6"  (1.676 m)   Wt 169 lb (76.7  kg)   BMI 27.28 kg/m    General: Well-nourished, well-developed in no acute distress.  Head: Normocephalic, atraumatic.   Eyes: Conjunctiva pink, no icterus. Mouth: Oropharyngeal mucosa moist and pink , no lesions erythema or exudate. Neck: Supple without thyromegaly, masses, or lymphadenopathy.  Lungs: Clear to auscultation bilaterally.  Heart: Regular rate and rhythm, no murmurs rubs or gallops.  Abdomen: Bowel sounds are normal, nontender, nondistended, no hepatosplenomegaly or masses, no abdominal bruits or    hernia , no rebound or guarding.   Rectal: not performed Extremities: No lower extremity edema. No  clubbing or deformities.  Neuro: Alert and oriented x 4 , grossly normal neurologically.  Skin: Warm and dry, no rash or jaundice.   Psych: Alert and cooperative, normal mood and affect.  Labs: Lab Results  Component Value Date   WBC 8.6 08/01/2016   HGB 12.7 normal 08/01/2016   HCT 39.0 08/01/2016   MCV 92 08/01/2016   PLT 291 08/01/2016   Lab Results  Component Value Date   CREATININE 0.88 08/01/2016   BUN 10 08/01/2016   NA 145 (H) 08/01/2016   K 4.5 08/01/2016   CL 105 08/01/2016   CO2 23 08/01/2016   Lab Results  Component Value Date   ALT 14 08/01/2016   AST 15 08/01/2016   ALKPHOS 76 08/01/2016   BILITOT 0.5 08/01/2016   No results found for: LIPASE Lab Results  Component Value Date   CHOL 259 (H) 08/01/2016   HDL 60 08/01/2016   LDLCALC 166 (H) 08/01/2016   TRIG 165 (H) 08/01/2016   CHOLHDL 4.3 08/01/2016     Imaging Studies: No results found.

## 2016-09-04 NOTE — Assessment & Plan Note (Signed)
Intermittent diarrhea chronically. Does have solid stools at times as well. No prior colonoscopy. Colonoscopy in near future. Deep sedation in OR given h/o antidepressants and psychiatric disease/anxiety..  I have discussed the risks, alternatives, benefits with regards to but not limited to the risk of reaction to medication, bleeding, infection, perforation and the patient is agreeable to proceed. Written consent to be obtained.

## 2016-09-04 NOTE — Patient Instructions (Signed)
1. Colonoscopy and upper endoscopy with Dr. Oneida Alar. See separate instructions.  2. Take omeprazole once daily before breakfast as prescribed by PCP.

## 2016-09-04 NOTE — Assessment & Plan Note (Signed)
Omeprazole 20mg  daily as prescribed. EGD/ED as planned for dysphagia.

## 2016-09-04 NOTE — Progress Notes (Signed)
CC'D TO PCP °

## 2016-09-04 NOTE — Telephone Encounter (Signed)
Tried to call pt multiple times to inform of pre-op appt. Mailbox is full and unable to leave message. Tried to call mobile number and no mailbox is set-up.

## 2016-09-04 NOTE — Telephone Encounter (Signed)
Tried to call pt to inform of pre-op appt 09/07/16 at 8:00 am. Unable to leave message.

## 2016-09-04 NOTE — Telephone Encounter (Signed)
Pt called office. Informed of pre-op appt.

## 2016-09-05 NOTE — Patient Instructions (Signed)
[place]  09/05/2016     @PREFPERIOPPHARMACY @   Your procedure is scheduled on  09/12/2016  Report to [facility] at  1045  A.M.  Call this number if you have problems the morning of surgery:  [phone]   Remember:  Do not eat food or drink liquids after midnight.  Take these medicines the morning of surgery with A SIP OF WATER  Mobic, prilosec.   Do not wear jewelry, make-up or nail polish.  Do not wear lotions, powders, or perfumes, or deoderant.  Do not shave 48 hours prior to surgery.  Men may shave face and neck.  Do not bring valuables to the hospital.  [facility] is not responsible for any belongings or valuables.  Contacts, dentures or bridgework may not be worn into surgery.  Leave your suitcase in the car.  After surgery it may be brought to your room.  For patients admitted to the hospital, discharge time will be determined by your treatment team.  Patients discharged the day of surgery will not be allowed to drive home.   Name and phone number of your driver:   family Special instructions:  Follow the diet and prep instructions given to you by Dr Nona Dell office.  Please read over the following fact sheets that you were given. Anesthesia Post-op Instructions and Care and Recovery After Surgery       Esophagogastroduodenoscopy Introduction Esophagogastroduodenoscopy (EGD) is a procedure to examine the lining of the esophagus, stomach, and first part of the small intestine (duodenum). This procedure is done to check for problems such as inflammation, bleeding, ulcers, or growths. During this procedure, a long, flexible, lighted tube with a camera attached (endoscope) is inserted down the throat. Tell a health care provider about:  Any allergies you have.  All medicines you are taking, including vitamins, herbs, eye drops, creams, and over-the-counter medicines.  Any problems you or family members have had with anesthetic  medicines.  Any blood disorders you have.  Any surgeries you have had.  Any medical conditions you have.  Whether you are pregnant or may be pregnant. What are the risks? Generally, this is a safe procedure. However, problems may occur, including:  Infection.  Bleeding.  A tear (perforation) in the esophagus, stomach, or duodenum.  Trouble breathing.  Excessive sweating.  Spasms of the larynx.  A slowed heartbeat.  Low blood pressure. What happens before the procedure?  Follow instructions from your health care provider about eating or drinking restrictions.  Ask your health care provider about:  Changing or stopping your regular medicines. This is especially important if you are taking diabetes medicines or blood thinners.  Taking medicines such as aspirin and ibuprofen. These medicines can thin your blood. Do not take these medicines before your procedure if your health care provider instructs you not to.  Plan to have someone take you home after the procedure.  If you wear dentures, be ready to remove them before the procedure. What happens during the procedure?  To reduce your risk of infection, your health care team will wash or sanitize their hands.  An IV tube will be put in a vein in your hand or arm. You will get medicines and fluids through this tube.  You will be given one or more of the following:  A medicine to help you relax (sedative).  A medicine to numb the area (local anesthetic).  This medicine may be sprayed into your throat. It will make you feel more comfortable and keep you from gagging or coughing during the procedure.  A medicine for pain.  A mouth guard may be placed in your mouth to protect your teeth and to keep you from biting on the endoscope.  You will be asked to lie on your left side.  The endoscope will be lowered down your throat into your esophagus, stomach, and duodenum.  Air will be put into the endoscope. This will help  your health care provider see better.  The lining of your esophagus, stomach, and duodenum will be examined.  Your health care provider may:  Take a tissue sample so it can be looked at in a lab (biopsy).  Remove growths.  Remove objects (foreign bodies) that are stuck.  Treat any bleeding with medicines or other devices that stop tissue from bleeding.  Widen (dilate) or stretch narrowed areas of your esophagus and stomach.  The endoscope will be taken out. The procedure may vary among health care providers and hospitals. What happens after the procedure?  Your blood pressure, heart rate, breathing rate, and blood oxygen level will be monitored often until the medicines you were given have worn off.  Do not eat or drink anything until the numbing medicine has worn off and your gag reflex has returned. This information is not intended to replace advice given to you by your health care provider. Make sure you discuss any questions you have with your health care provider. Document Released: 12/15/2004 Document Revised: 01/20/2016 Document Reviewed: 07/08/2015  2017 Elsevier Esophagogastroduodenoscopy, Care After Introduction Refer to this sheet in the next few weeks. These instructions provide you with information about caring for yourself after your procedure. Your health care provider may also give you more specific instructions. Your treatment has been planned according to current medical practices, but problems sometimes occur. Call your health care provider if you have any problems or questions after your procedure. What can I expect after the procedure? After the procedure, it is common to have:  A sore throat.  Nausea.  Bloating.  Dizziness.  Fatigue. Follow these instructions at home:  Do not eat or drink anything until the numbing medicine (local anesthetic) has worn off and your gag reflex has returned. You will know that the local anesthetic has worn off when you  can swallow comfortably.  Do not drive for 24 hours if you received a medicine to help you relax (sedative).  If your health care provider took a tissue sample for testing during the procedure, make sure to get your test results. This is your responsibility. Ask your health care provider or the department performing the test when your results will be ready.  Keep all follow-up visits as told by your health care provider. This is important. Contact a health care provider if:  You cannot stop coughing.  You are not urinating.  You are urinating less than usual. Get help right away if:  You have trouble swallowing.  You cannot eat or drink.  You have throat or chest pain that gets worse.  You are dizzy or light-headed.  You faint.  You have nausea or vomiting.  You have chills.  You have a fever.  You have severe abdominal pain.  You have black, tarry, or bloody stools. This information is not intended to replace advice given to you by your health care provider. Make sure you discuss any questions you have with your health  care provider. Document Released: 07/31/2012 Document Revised: 01/20/2016 Document Reviewed: 07/08/2015  2017 Elsevier  Esophageal Dilatation Esophageal dilatation is a procedure to open a blocked or narrowed part of the esophagus. The esophagus is the long tube in your throat that carries food and liquid from your mouth to your stomach. The procedure is also called esophageal dilation. You may need this procedure if you have a buildup of scar tissue in your esophagus that makes it difficult, painful, or even impossible to swallow. This can be caused by gastroesophageal reflux disease (GERD). In rare cases, people need this procedure because they have cancer of the esophagus or a problem with the way food moves through the esophagus. Sometimes you may need to have another dilatation to enlarge the opening of the esophagus gradually. Tell a health care  provider about:  Any allergies you have.  All medicines you are taking, including vitamins, herbs, eye drops, creams, and over-the-counter medicines.  Any problems you or family members have had with anesthetic medicines.  Any blood disorders you have.  Any surgeries you have had.  Any medical conditions you have.  Any antibiotic medicines you are required to take before dental procedures. What are the risks? Generally, this is a safe procedure. However, problems can occur and include:  Bleeding from a tear in the lining of the esophagus.  A hole (perforation) in the esophagus. What happens before the procedure?  Do not eat or drink anything after midnight on the night before the procedure or as directed by your health care provider.  Ask your health care provider about changing or stopping your regular medicines. This is especially important if you are taking diabetes medicines or blood thinners.  Plan to have someone take you home after the procedure. What happens during the procedure?  You will be given a medicine that makes you relaxed and sleepy (sedative).  A medicine may be sprayed or gargled to numb the back of the throat.  Your health care provider can use various instruments to do an esophageal dilatation. During the procedure, the instrument used will be placed in your mouth and passed down into your esophagus. Options include:  Simple dilators. This instrument is carefully placed in the esophagus to stretch it.  Guided wire bougies. In this method, a flexible tube (endoscope) is used to insert a wire into the esophagus. The dilator is passed over this wire to enlarge the esophagus. Then the wire is removed.  Balloon dilators. An endoscope with a small balloon at the end is passed down into the esophagus. Inflating the balloon gently stretches the esophagus and opens it up. What happens after the procedure?  Your blood pressure, heart rate, breathing rate, and  blood oxygen level will be monitored often until the medicines you were given have worn off.  Your throat may feel slightly sore and will probably still feel numb. This will improve slowly over time.  You will not be allowed to eat or drink until the throat numbness has resolved.  If this is a same-day procedure, you may be allowed to go home once you have been able to drink, urinate, and sit on the edge of the bed without nausea or dizziness.  If this is a same-day procedure, you should have a friend or family member with you for the next 24 hours after the procedure. This information is not intended to replace advice given to you by your health care provider. Make sure you discuss any questions you have with  your health care provider. Document Released: 10/05/2005 Document Revised: 01/20/2016 Document Reviewed: 12/24/2013 Elsevier Interactive Patient Education  2017 [place]  Colonoscopy, Adult A colonoscopy is an exam to look at the entire large intestine. During the exam, a lubricated, bendable tube is inserted into the anus and then passed into the rectum, colon, and other parts of the large intestine. A colonoscopy is often done as a part of normal colorectal screening or in response to certain symptoms, such as anemia, persistent diarrhea, abdominal pain, and blood in the stool. The exam can help screen for and diagnose medical problems, including:  Tumors.  Polyps.  Inflammation.  Areas of bleeding. Tell a health care provider about:  Any allergies you have.  All medicines you are taking, including vitamins, herbs, eye drops, creams, and over-the-counter medicines.  Any problems you or family members have had with anesthetic medicines.  Any blood disorders you have.  Any surgeries you have had.  Any medical conditions you have.  Any problems you have had passing stool. What are the risks? Generally, this is a safe procedure. However, problems may occur,  including:  Bleeding.  A tear in the intestine.  A reaction to medicines given during the exam.  Infection (rare). What happens before the procedure? Eating and drinking restrictions  Follow instructions from your health care provider about eating and drinking, which may include:  A few days before the procedure - follow a low-fiber diet. Avoid nuts, seeds, dried fruit, raw fruits, and vegetables.  1-3 days before the procedure - follow a clear liquid diet. Drink only clear liquids, such as clear broth or bouillon, black coffee or tea, clear juice, clear soft drinks or sports drinks, gelatin desert, and popsicles. Avoid any liquids that contain red or purple dye.  On the day of the procedure - do not eat or drink anything during the 2 hours before the procedure, or within the time period that your health care provider recommends. Bowel prep  If you were prescribed an oral bowel prep to clean out your colon:  Take it as told by your health care provider. Starting the day before your procedure, you will need to drink a large amount of medicated liquid. The liquid will cause you to have multiple loose stools until your stool is almost clear or light green.  If your skin or anus gets irritated from diarrhea, you may use these to relieve the irritation:  Medicated wipes, such as adult wet wipes with aloe and vitamin E.  A skin soothing-product like petroleum jelly.  If you vomit while drinking the bowel prep, take a break for up to 60 minutes and then begin the bowel prep again. If vomiting continues and you cannot take the bowel prep without vomiting, call your health care provider. General instructions  Ask your health care provider about changing or stopping your regular medicines. This is especially important if you are taking diabetes medicines or blood thinners.  Plan to have someone take you home from the hospital or clinic. What happens during the procedure?  An IV tube may be  inserted into one of your veins.  You will be given medicine to help you relax (sedative).  To reduce your risk of infection:  Your health care team will wash or sanitize their hands.  Your anal area will be washed with soap.  You will be asked to lie on your side with your knees bent.  Your health care provider will lubricate a long, thin, flexible  tube. The tube will have a camera and a light on the end.  The tube will be inserted into your anus.  The tube will be gently eased through your rectum and colon.  Air will be delivered into your colon to keep it open. You may feel some pressure or cramping.  The camera will be used to take images during the procedure.  A small tissue sample may be removed from your body to be examined under a microscope (biopsy). If any potential problems are found, the tissue will be sent to a lab for testing.  If small polyps are found, your health care provider may remove them and have them checked for cancer cells.  The tube that was inserted into your anus will be slowly removed. The procedure may vary among health care providers and hospitals. What happens after the procedure?  Your blood pressure, heart rate, breathing rate, and blood oxygen level will be monitored until the medicines you were given have worn off.  Do not drive for 24 hours after the exam.  You may have a small amount of blood in your stool.  You may pass gas and have mild abdominal cramping or bloating due to the air that was used to inflate your colon during the exam.  It is up to you to get the results of your procedure. Ask your health care provider, or the department performing the procedure, when your results will be ready. This information is not intended to replace advice given to you by your health care provider. Make sure you discuss any questions you have with your health care provider. Document Released: 08/11/2000 Document Revised: 03/03/2016 Document Reviewed:  10/26/2015 Elsevier Interactive Patient Education  2017 [employer]  Colonoscopy, Adult, Care After This sheet gives you information about how to care for yourself after your procedure. Your health care provider may also give you more specific instructions. If you have problems or questions, contact your health care provider. What can I expect after the procedure? After the procedure, it is common to have:  A small amount of blood in your stool for 24 hours after the procedure.  Some gas.  Mild abdominal cramping or bloating. Follow these instructions at home: General instructions  For the first 24 hours after the procedure:  Do not drive or use machinery.  Do not sign important documents.  Do not drink alcohol.  Do your regular daily activities at a slower pace than normal.  Eat soft, easy-to-digest foods.  Rest often.  Take over-the-counter or prescription medicines only as told by your health care provider.  It is up to you to get the results of your procedure. Ask your health care provider, or the department performing the procedure, when your results will be ready. Relieving cramping and bloating  Try walking around when you have cramps or feel bloated.  Apply heat to your abdomen as told by your health care provider. Use a heat source that your health care provider recommends, such as a moist heat pack or a heating pad.  Place a towel between your skin and the heat source.  Leave the heat on for 20-30 minutes.  Remove the heat if your skin turns bright red. This is especially important if you are unable to feel pain, heat, or cold. You may have a greater risk of getting burned. Eating and drinking  Drink enough fluid to keep your urine clear or pale yellow.  Resume your normal diet as instructed by your health care  provider. Avoid heavy or fried foods that are hard to digest.  Avoid drinking alcohol for as long as instructed by your health care  provider. Contact a health care provider if:  You have blood in your stool 2-3 days after the procedure. Get help right away if:  You have more than a small spotting of blood in your stool.  You pass large blood clots in your stool.  Your abdomen is swollen.  You have nausea or vomiting.  You have a fever.  You have increasing abdominal pain that is not relieved with medicine. This information is not intended to replace advice given to you by your health care provider. Make sure you discuss any questions you have with your health care provider. Document Released: 03/28/2004 Document Revised: 05/08/2016 Document Reviewed: 10/26/2015 Elsevier Interactive Patient Education  2017 [place] Anesthesia is a term that refers to techniques, procedures, and medicines that help a person stay safe and comfortable during a medical procedure. Monitored anesthesia care, or sedation, is one type of anesthesia. Your anesthesia specialist may recommend sedation if you will be having a procedure that does not require you to be unconscious, such as:  Cataract surgery.  A dental procedure.  A biopsy.  A colonoscopy. During the procedure, you may receive a medicine to help you relax (sedative). There are three levels of sedation:  Mild sedation. At this level, you may feel awake and relaxed. You will be able to follow directions.  Moderate sedation. At this level, you will be sleepy. You may not remember the procedure.  Deep sedation. At this level, you will be asleep. You will not remember the procedure. The more medicine you are given, the deeper your level of sedation will be. Depending on how you respond to the procedure, the anesthesia specialist may change your level of sedation or the type of anesthesia to fit your needs. An anesthesia specialist will monitor you closely during the procedure. Let your health care provider know about:  Any allergies you  have.  All medicines you are taking, including vitamins, herbs, eye drops, creams, and over-the-counter medicines.  Any use of steroids (by mouth or as a cream).  Any problems you or family members have had with sedatives and anesthetic medicines.  Any blood disorders you have.  Any surgeries you have had.  Any medical conditions you have, such as sleep apnea.  Whether you are pregnant or may be pregnant.  Any use of cigarettes, alcohol, or street drugs. What are the risks? Generally, this is a safe procedure. However, problems may occur, including:  Getting too much medicine (oversedation).  Nausea.  Allergic reaction to medicines.  Trouble breathing. If this happens, a breathing tube may be used to help with breathing. It will be removed when you are awake and breathing on your own.  Heart trouble.  Lung trouble. Before the procedure Staying hydrated  Follow instructions from your health care provider about hydration, which may include:  Up to 2 hours before the procedure - you may continue to drink clear liquids, such as water, clear fruit juice, black coffee, and plain tea. Eating and drinking restrictions  Follow instructions from your health care provider about eating and drinking, which may include:  8 hours before the procedure - stop eating heavy meals or foods such as meat, fried foods, or fatty foods.  6 hours before the procedure - stop eating light meals or foods, such as toast or cereal.  6 hours  before the procedure - stop drinking milk or drinks that contain milk.  2 hours before the procedure - stop drinking clear liquids. Medicines  Ask your health care provider about:  Changing or stopping your regular medicines. This is especially important if you are taking diabetes medicines or blood thinners.  Taking medicines such as aspirin and ibuprofen. These medicines can thin your blood. Do not take these medicines before your procedure if your health  care provider instructs you not to. Tests and exams  You will have a physical exam.  You may have blood tests done to show:  How well your kidneys and liver are working.  How well your blood can clot.  General instructions  Plan to have someone take you home from the hospital or clinic.  If you will be going home right after the procedure, plan to have someone with you for 24 hours. What happens during the procedure?  Your blood pressure, heart rate, breathing, level of pain and overall condition will be monitored.  An IV tube will be inserted into one of your veins.  Your anesthesia specialist will give you medicines as needed to keep you comfortable during the procedure. This may mean changing the level of sedation.  The procedure will be performed. After the procedure  Your blood pressure, heart rate, breathing rate, and blood oxygen level will be monitored until the medicines you were given have worn off.  Do not drive for 24 hours if you received a sedative.  You may:  Feel sleepy, clumsy, or nauseous.  Feel forgetful about what happened after the procedure.  Have a sore throat if you had a breathing tube during the procedure.  Vomit. This information is not intended to replace advice given to you by your health care provider. Make sure you discuss any questions you have with your health care provider. Document Released: 05/10/2005 Document Revised: 01/21/2016 Document Reviewed: 12/05/2015 Elsevier Interactive Patient Education  2017 [place], Care After These instructions provide you with information about caring for yourself after your procedure. Your health care provider may also give you more specific instructions. Your treatment has been planned according to current medical practices, but problems sometimes occur. Call your health care provider if you have any problems or questions after your procedure. What can I expect after the  procedure? After your procedure, it is common to:  Feel sleepy for several hours.  Feel clumsy and have poor balance for several hours.  Feel forgetful about what happened after the procedure.  Have poor judgment for several hours.  Feel nauseous or vomit.  Have a sore throat if you had a breathing tube during the procedure. Follow these instructions at home: For at least 24 hours after the procedure:   Do not:  Participate in activities in which you could fall or become injured.  Drive.  Use heavy machinery.  Drink alcohol.  Take sleeping pills or medicines that cause drowsiness.  Make important decisions or sign legal documents.  Take care of children on your own.  Rest. Eating and drinking  Follow the diet that is recommended by your health care provider.  If you vomit, drink water, juice, or soup when you can drink without vomiting.  Make sure you have little or no nausea before eating solid foods. General instructions  Have a responsible adult stay with you until you are awake and alert.  Take over-the-counter and prescription medicines only as told by your health care provider.  If you smoke, do not smoke without supervision.  Keep all follow-up visits as told by your health care provider. This is important. Contact a health care provider if:  You keep feeling nauseous or you keep vomiting.  You feel light-headed.  You develop a rash.  You have a fever. Get help right away if:  You have trouble breathing. This information is not intended to replace advice given to you by your health care provider. Make sure you discuss any questions you have with your health care provider. Document Released: 12/05/2015 Document Revised: 04/05/2016 Document Reviewed: 12/05/2015 Elsevier Interactive Patient Education  2017 [employer].

## 2016-09-06 ENCOUNTER — Ambulatory Visit (HOSPITAL_COMMUNITY)
Admission: RE | Admit: 2016-09-06 | Discharge: 2016-09-06 | Disposition: A | Discharge status: Home / Self Care | Payer: Medicaid Other | Source: Ambulatory Visit | Attending: Pulmonary Disease | Admitting: Pulmonary Disease

## 2016-09-06 ENCOUNTER — Other Ambulatory Visit (HOSPITAL_COMMUNITY): Payer: Self-pay | Admitting: Pulmonary Disease

## 2016-09-06 DIAGNOSIS — J40 Bronchitis, not specified as acute or chronic: Secondary | ICD-10-CM | POA: Insufficient documentation

## 2016-09-06 DIAGNOSIS — R0602 Shortness of breath: Secondary | ICD-10-CM | POA: Insufficient documentation

## 2016-09-06 DIAGNOSIS — J9811 Atelectasis: Secondary | ICD-10-CM | POA: Insufficient documentation

## 2016-09-07 ENCOUNTER — Encounter (HOSPITAL_COMMUNITY): Payer: Self-pay

## 2016-09-07 ENCOUNTER — Encounter (HOSPITAL_COMMUNITY)
Admission: RE | Admit: 2016-09-07 | Discharge: 2016-09-07 | Disposition: A | Discharge status: Home / Self Care | Payer: Medicaid Other | Source: Ambulatory Visit | Attending: Gastroenterology | Admitting: Gastroenterology

## 2016-09-08 ENCOUNTER — Telehealth: Payer: Self-pay

## 2016-09-08 ENCOUNTER — Encounter: Payer: Self-pay | Admitting: Physician Assistant

## 2016-09-08 ENCOUNTER — Other Ambulatory Visit (HOSPITAL_COMMUNITY): Payer: Self-pay | Admitting: Respiratory Therapy

## 2016-09-08 ENCOUNTER — Ambulatory Visit (INDEPENDENT_AMBULATORY_CARE_PROVIDER_SITE_OTHER): Payer: Medicaid Other | Admitting: Physician Assistant

## 2016-09-08 VITALS — BP 135/86 | HR 95 | Temp 97.4°F | Ht 66.0 in | Wt 172.8 lb

## 2016-09-08 DIAGNOSIS — K2100 Gastro-esophageal reflux disease with esophagitis, without bleeding: Secondary | ICD-10-CM

## 2016-09-08 DIAGNOSIS — R1310 Dysphagia, unspecified: Secondary | ICD-10-CM | POA: Diagnosis not present

## 2016-09-08 DIAGNOSIS — G44229 Chronic tension-type headache, not intractable: Secondary | ICD-10-CM | POA: Diagnosis not present

## 2016-09-08 DIAGNOSIS — F39 Unspecified mood [affective] disorder: Secondary | ICD-10-CM | POA: Diagnosis not present

## 2016-09-08 DIAGNOSIS — R0602 Shortness of breath: Secondary | ICD-10-CM

## 2016-09-08 DIAGNOSIS — R1319 Other dysphagia: Secondary | ICD-10-CM

## 2016-09-08 MED ORDER — SUMATRIPTAN SUCCINATE 100 MG PO TABS: 100.0000 mg | ORAL_TABLET | ORAL | 2 refills | Status: DC | PRN

## 2016-09-08 MED ORDER — ARIPIPRAZOLE 5 MG PO TABS: 5.0000 mg | ORAL_TABLET | Freq: Every day | ORAL | 1 refills | Status: DC
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
## 2016-09-08 NOTE — Patient Instructions (Signed)

## 2016-09-08 NOTE — Telephone Encounter (Signed)
Endo scheduler called this am and wanted pt to be notified to arrive at hospital at 9:45 am 09/12/16 for TCS/EGD/ED w/Propofol. LMOVM and informed pt. New instruction times given to drink prep on morning of procedure. Drink 2nd 1/2 of prep at 6:15am and NPO after 8:15am. Asked pt to call office to let us know she got the message.

## 2016-09-12 ENCOUNTER — Ambulatory Visit (HOSPITAL_COMMUNITY)
Admission: RE | Admit: 2016-09-12 | Discharge: 2016-09-12 | Disposition: A | Discharge status: Home / Self Care | Payer: Medicaid Other | Source: Ambulatory Visit | Attending: Gastroenterology | Admitting: Gastroenterology

## 2016-09-12 ENCOUNTER — Encounter (HOSPITAL_COMMUNITY): Payer: Self-pay

## 2016-09-12 ENCOUNTER — Encounter (HOSPITAL_COMMUNITY)
Admission: RE | Disposition: A | Discharge status: Home / Self Care | Payer: Self-pay | Source: Ambulatory Visit | Attending: Gastroenterology

## 2016-09-12 ENCOUNTER — Ambulatory Visit (HOSPITAL_COMMUNITY): Payer: Medicaid Other | Admitting: Anesthesiology

## 2016-09-12 DIAGNOSIS — D123 Benign neoplasm of transverse colon: Secondary | ICD-10-CM | POA: Diagnosis not present

## 2016-09-12 DIAGNOSIS — K219 Gastro-esophageal reflux disease without esophagitis: Secondary | ICD-10-CM | POA: Insufficient documentation

## 2016-09-12 DIAGNOSIS — K2970 Gastritis, unspecified, without bleeding: Secondary | ICD-10-CM

## 2016-09-12 DIAGNOSIS — K2950 Unspecified chronic gastritis without bleeding: Secondary | ICD-10-CM | POA: Insufficient documentation

## 2016-09-12 DIAGNOSIS — Z791 Long term (current) use of non-steroidal anti-inflammatories (NSAID): Secondary | ICD-10-CM | POA: Diagnosis not present

## 2016-09-12 DIAGNOSIS — Q438 Other specified congenital malformations of intestine: Secondary | ICD-10-CM | POA: Diagnosis not present

## 2016-09-12 DIAGNOSIS — R1310 Dysphagia, unspecified: Secondary | ICD-10-CM | POA: Diagnosis not present

## 2016-09-12 DIAGNOSIS — R197 Diarrhea, unspecified: Secondary | ICD-10-CM | POA: Diagnosis not present

## 2016-09-12 DIAGNOSIS — K222 Esophageal obstruction: Secondary | ICD-10-CM | POA: Diagnosis not present

## 2016-09-12 DIAGNOSIS — K449 Diaphragmatic hernia without obstruction or gangrene: Secondary | ICD-10-CM | POA: Insufficient documentation

## 2016-09-12 DIAGNOSIS — Z79899 Other long term (current) drug therapy: Secondary | ICD-10-CM | POA: Diagnosis not present

## 2016-09-12 DIAGNOSIS — K644 Residual hemorrhoidal skin tags: Secondary | ICD-10-CM | POA: Insufficient documentation

## 2016-09-12 DIAGNOSIS — F17210 Nicotine dependence, cigarettes, uncomplicated: Secondary | ICD-10-CM | POA: Insufficient documentation

## 2016-09-12 DIAGNOSIS — J449 Chronic obstructive pulmonary disease, unspecified: Secondary | ICD-10-CM | POA: Diagnosis not present

## 2016-09-12 HISTORY — PX: BIOPSY: SHX5522

## 2016-09-12 HISTORY — DX: Personal history of urinary calculi: Z87.442

## 2016-09-12 HISTORY — PX: COLONOSCOPY WITH PROPOFOL: SHX5780

## 2016-09-12 HISTORY — PX: POLYPECTOMY: SHX5525

## 2016-09-12 HISTORY — PX: SAVORY DILATION: SHX5439

## 2016-09-12 HISTORY — PX: ESOPHAGOGASTRODUODENOSCOPY (EGD) WITH PROPOFOL: SHX5813

## 2016-09-12 SURGERY — COLONOSCOPY WITH PROPOFOL
Anesthesia: Monitor Anesthesia Care

## 2016-09-12 MED ORDER — FENTANYL CITRATE (PF) 100 MCG/2ML IJ SOLN
25.0000 ug | INTRAMUSCULAR | Status: AC | PRN
  Administered 2016-09-12 (×2): 25 ug via INTRAVENOUS

## 2016-09-12 MED ORDER — CHLORHEXIDINE GLUCONATE CLOTH 2 % EX PADS: 6.0000 | MEDICATED_PAD | Freq: Once | CUTANEOUS | Status: DC

## 2016-09-12 MED ORDER — MIDAZOLAM HCL 2 MG/2ML IJ SOLN
0.5000 mg | INTRAMUSCULAR | Status: DC | PRN
  Administered 2016-09-12 (×3): 2 mg via INTRAVENOUS
  Filled 2016-09-12 (×3): qty 2

## 2016-09-12 MED ORDER — LACTATED RINGERS IV SOLN
INTRAVENOUS | Status: DC
  Administered 2016-09-12 (×2): via INTRAVENOUS

## 2016-09-12 MED ORDER — MIDAZOLAM HCL 5 MG/5ML IJ SOLN
INTRAMUSCULAR | Status: DC | PRN
  Administered 2016-09-12: 2 mg via INTRAVENOUS

## 2016-09-12 MED ORDER — PROPOFOL 500 MG/50ML IV EMUL
INTRAVENOUS | Status: DC | PRN
  Administered 2016-09-12: 12:00:00 via INTRAVENOUS
  Administered 2016-09-12: 125 ug/kg/min via INTRAVENOUS
  Administered 2016-09-12: 12:00:00 via INTRAVENOUS

## 2016-09-12 MED ORDER — PROPOFOL 10 MG/ML IV BOLUS
INTRAVENOUS | Status: AC
  Filled 2016-09-12: qty 20

## 2016-09-12 MED ORDER — PROPOFOL 10 MG/ML IV BOLUS
INTRAVENOUS | Status: AC
  Filled 2016-09-12: qty 40

## 2016-09-12 MED ORDER — LIDOCAINE VISCOUS 2 % MT SOLN
15.0000 mL | Freq: Once | OROMUCOSAL | Status: AC
  Administered 2016-09-12: 15 mL via OROMUCOSAL

## 2016-09-12 MED ORDER — LIDOCAINE VISCOUS 2 % MT SOLN
OROMUCOSAL | Status: AC
  Filled 2016-09-12: qty 15

## 2016-09-12 MED ORDER — SIMETHICONE 40 MG/0.6ML PO SUSP
ORAL | Status: DC | PRN
  Administered 2016-09-12: 200 mL

## 2016-09-12 MED ORDER — FENTANYL CITRATE (PF) 100 MCG/2ML IJ SOLN
INTRAMUSCULAR | Status: AC
  Filled 2016-09-12: qty 2

## 2016-09-12 MED ORDER — MIDAZOLAM HCL 2 MG/2ML IJ SOLN
INTRAMUSCULAR | Status: AC
  Filled 2016-09-12: qty 2

## 2016-09-12 NOTE — H&P (Signed)
Primary Care Physician:  Terald Sleeper, PA-C Primary Gastroenterologist:  Dr. Oneida Alar  Pre-Procedure History & Physical: HPI:  Nancy Delacruz is a 56 y.o. female here for [facility].  Past Medical History:  Diagnosis Date  . Anxiety   . Bipolar 1 disorder ([place])   . Depression   . History of kidney stones     Past Surgical History:  Procedure Laterality Date  . CHOLECYSTECTOMY    . HAND SURGERY     left, tendon    Prior to Admission medications   Medication Sig Start Date End Date Taking? Authorizing Provider  ARIPiprazole (ABILIFY) 5 MG tablet Take 1 tablet (5 mg total) by mouth daily. 09/08/16  Yes Terald Sleeper, PA-C  Aspirin-Acetaminophen-Caffeine (GOODY HEADACHE PO) Take 2 packets by mouth daily as needed (headaches).   Yes Historical Provider, MD  meloxicam (MOBIC) 7.5 MG tablet Take 1 tablet (7.5 mg total) by mouth daily. For inflammation 08/01/16  Yes Terald Sleeper, PA-C  omeprazole (PRILOSEC) 20 MG capsule Take 1 capsule (20 mg total) by mouth daily. 08/01/16  Yes Terald Sleeper, PA-C  promethazine (PHENERGAN) 25 MG tablet Take 25 mg by mouth every 8 (eight) hours as needed for nausea/vomiting. 08/01/16  Yes Historical Provider, MD  Sod Picosulfate-Mag Ox-Cit Acd ([place]) 10-3.5-12 MG-GM-GM PACK Take 1 Container by mouth as directed. 09/04/16  Yes Danie Binder, MD  SUMAtriptan (IMITREX) 100 MG tablet Take 1 tablet (100 mg total) by mouth every 2 (two) hours as needed for migraine. May repeat in 2 hours if headache persists or recurs. 09/08/16  Yes Terald Sleeper, PA-C    Allergies as of 09/04/2016 - Review Complete 09/04/2016  Allergen Reaction Noted  . Keflex [cephalexin] Rash 09/15/2015    Family History  Problem Relation Age of Onset  . Diabetes Mother   . Diabetes Father   . Cancer Brother     ?lung  . Cancer Brother   . Colon cancer Neg Hx     Social History   Social History  . Marital status: Divorced    Spouse name: N/A  . Number of children: N/A   . Years of education: N/A   Occupational History  . Not on file.   Social History Main Topics  . Smoking status: Current Every Day Smoker    Packs/day: 1.00    Types: Cigarettes  . Smokeless tobacco: Former Systems developer  . Alcohol use No  . Drug use: No  . Sexual activity: Not on file   Other Topics Concern  . Not on file   Social History Narrative  . No narrative on file    Review of Systems: See HPI, otherwise negative ROS   Physical Exam: BP 131/84   Pulse 90   Temp 97.6 F (36.4 C) (Oral)   Resp 20   Ht 5\' 6"  (1.676 m)   Wt 172 lb (78 kg)   BMI 27.76 kg/m  General:   Alert,  pleasant and cooperative in NAD Head:  Normocephalic and atraumatic. Neck:  Supple; Lungs:  Clear throughout to auscultation.    Heart:  Regular rate and rhythm. Abdomen:  Soft, nontender and nondistended. Normal bowel sounds, without guarding, and without rebound.   Neurologic:  Alert and  oriented x4;  grossly normal neurologically.  Impression/Plan:     DYSPHAGIA/DIARRHEA  PLAN:  EGD/DIL-DUODENAL bX/TCS TODAY. DISCUSSED PROCEDURE, BENEFITS, & RISKS: < 1% chance of medication reaction, bleeding, perforation, or rupture of spleen/liver.

## 2016-09-12 NOTE — Anesthesia Preprocedure Evaluation (Signed)
Anesthesia Evaluation  Patient identified by MRN, date of birth, ID band Patient awake    Reviewed: Allergy & Precautions, NPO status , Patient's Chart, lab work & pertinent test results  Airway Mallampati: II  TM Distance: >3 FB     Dental  (+) Poor Dentition, Missing   Pulmonary COPD, Current Smoker,    breath sounds clear to auscultation       Cardiovascular negative cardio ROS   Rhythm:Regular Rate:Normal     Neuro/Psych  Headaches, PSYCHIATRIC DISORDERS Anxiety Depression Bipolar Disorder    GI/Hepatic negative GI ROS, GERD (dysphagis)  ,  Endo/Other    Renal/GU      Musculoskeletal   Abdominal   Peds  Hematology   Anesthesia Other Findings   Reproductive/Obstetrics                             Anesthesia Physical Anesthesia Plan  ASA: III  Anesthesia Plan: MAC   Post-op Pain Management:    Induction: Intravenous  Airway Management Planned: Simple Face Mask  Additional Equipment:   Intra-op Plan:   Post-operative Plan:   Informed Consent: I have reviewed the patients History and Physical, chart, labs and discussed the procedure including the risks, benefits and alternatives for the proposed anesthesia with the patient or authorized representative who has indicated his/her understanding and acceptance.     Plan Discussed with:   Anesthesia Plan Comments:         Anesthesia Quick Evaluation

## 2016-09-12 NOTE — Anesthesia Postprocedure Evaluation (Signed)
Anesthesia Post Note  Patient: [place]  Procedure(s) Performed: Procedure(s) (LRB): COLONOSCOPY WITH PROPOFOL (N/A) ESOPHAGOGASTRODUODENOSCOPY (EGD) WITH PROPOFOL (N/A) SAVORY DILATION (N/A) POLYPECTOMY BIOPSY  Patient location during evaluation: PACU Anesthesia Type: MAC Level of consciousness: awake and patient cooperative Pain management: pain level controlled Vital Signs Assessment: post-procedure vital signs reviewed and stable Respiratory status: spontaneous breathing, nonlabored ventilation and respiratory function stable Cardiovascular status: blood pressure returned to baseline Postop Assessment: no signs of nausea or vomiting Anesthetic complications: no     Last Vitals:  Vitals:   09/12/16 1055 09/12/16 1100  BP: 133/84 136/80  Pulse:    Resp: 15 13  Temp:      Last Pain:  Vitals:   09/12/16 1005  TempSrc: Oral                 Genesis Novosad J

## 2016-09-12 NOTE — Transfer of Care (Signed)
Immediate Anesthesia Transfer of Care Note  Patient: Nancy Delacruz  Procedure(s) Performed: Procedure(s) with comments: COLONOSCOPY WITH PROPOFOL (N/A) - 12:30 pm ESOPHAGOGASTRODUODENOSCOPY (EGD) WITH PROPOFOL (N/A) SAVORY DILATION (N/A) POLYPECTOMY - transverse colon polyps x2 BIOPSY - random colon bx's, duodenal bx's, gastric bx's  Patient Location: PACU  Anesthesia Type:MAC  Level of Consciousness: awake and patient cooperative  Airway & Oxygen Therapy: Patient Spontanous Breathing and Patient connected to face mask oxygen  Post-op Assessment: Report given to RN, Post -op Vital signs reviewed and stable and Patient moving all extremities  Post vital signs: Reviewed and stable  Last Vitals:  Vitals:   09/12/16 1055 09/12/16 1100  BP: 133/84 136/80  Pulse:    Resp: 15 13  Temp:      Last Pain:  Vitals:   09/12/16 1005  TempSrc: Oral      Patients Stated Pain Goal: 4 (67/34/19 3790)  Complications: No apparent anesthesia complications

## 2016-09-12 NOTE — Op Note (Signed)
[facility] Patient Name: Nancy Delacruz Procedure Date: 09/12/2016 12:16 PM MRN: [id number] Date of Birth: [birth year] Attending MD: Barney Drain , MD CSN: [id number] Age: 56 Admit Type: Outpatient Procedure:                Upper GI endoscopy WITH COLD FORCEPS                            BIOPSY/ESOPHAGEAL DILATION Indications:              Diarrhea Providers:                Barney Drain, MD, Rosina Lowenstein, RN, Aram Candela Referring MD:             Terald Sleeper Medicines:                Propofol per Anesthesia Complications:            No immediate complications. Estimated Blood Loss:     Estimated blood loss was minimal. Procedure:                Pre-Anesthesia Assessment:                           - Prior to the procedure, a History and Physical                            was performed, and patient medications and                            allergies were reviewed. The patient's tolerance of                            previous anesthesia was also reviewed. The risks                            and benefits of the procedure and the sedation                            options and risks were discussed with the patient.                            All questions were answered, and informed consent                            was obtained. Prior Anticoagulants: The patient has                            taken no previous anticoagulant or antiplatelet                            agents. ASA Grade Assessment: II - A patient with                            mild systemic disease. After reviewing the risks  and benefits, the patient was deemed in                            satisfactory condition to undergo the procedure.                            After obtaining informed consent, the endoscope was                            passed under direct vision. Throughout the                            procedure, the patient's blood pressure, pulse, and                             oxygen saturations were monitored continuously. The                            [id number] scope was introduced through the                            mouth, and advanced to the second part of duodenum.                            The upper GI endoscopy was accomplished without                            difficulty. The patient tolerated the procedure                            well. Scope In: 12:20:15 PM Scope Out: 12:32:53 PM Total Procedure Duration: 0 hours 12 minutes 38 seconds  Findings:      One moderate (circumferential scarring or stenosis; an endoscope may       pass) benign-appearing, intrinsic stenosis was found. And was traversed.       A guidewire was placed and the scope was withdrawn. Dilation was       performed with a Savary dilator with mild resistance at 14 mm, 15 mm, 16       mm and 17 mm. Estimated blood loss was minimal.      A small hiatal hernia was present.      Patchy mild inflammation characterized by erosions was found in the       gastric antrum. Biopsies were taken with a cold forceps for Helicobacter       pylori testing.      The examined duodenum was normal. Biopsies for histology were taken with       a cold forceps for evaluation of celiac disease. Impression:               - DYSPHAGIA DUE TO Benign-appearing PEPTIC                            STRICTURE.                           - Small hiatal hernia.                           -  MILD EROSIVE Gastritis.                           - NO OBVIOUS SOURCE FOR DIARRHEA IDENTIFIED. Moderate Sedation:      Per Anesthesia Care Recommendation:           - High fiber diet and low fat diet.                           - Continue present medications.                           - Await pathology results.                           - Return to my office in 4 months.                           - Patient has a contact number available for                            emergencies. The signs and symptoms of potential                             delayed complications were discussed with the                            patient. Return to normal activities tomorrow.                            Written discharge instructions were provided to the                            patient. Procedure Code(s):        --- Professional ---                           [phone], Esophagogastroduodenoscopy, flexible,                            transoral; with insertion of guide wire followed by                            passage of dilator(s) through esophagus over guide                            wire                           [id number], Esophagogastroduodenoscopy, flexible,                            transoral; with biopsy, single or multiple Diagnosis Code(s):        --- Professional ---                           K22.2, Esophageal obstruction  K44.9, Diaphragmatic hernia without obstruction or                            gangrene                           K29.70, Gastritis, unspecified, without bleeding                           R19.7, Diarrhea, unspecified CPT copyright 2016 [employer]. All rights reserved. The codes documented in this report are preliminary and upon coder review may  be revised to meet current compliance requirements. Barney Drain, MD Barney Drain, MD 09/12/2016 12:58:09 PM This report has been signed electronically. Number of Addenda: 0

## 2016-09-12 NOTE — Op Note (Signed)
[facility] Patient Name: Nancy Delacruz Procedure Date: 09/12/2016 11:25 AM MRN: [id number] Date of Birth: October 17, 1960 Attending MD: Barney Drain , MD CSN: [id number] Age: 56 Admit Type: Outpatient Procedure:                Colonoscopywith COLD SNARE POLYPECTOMY Indications:              Clinically significant diarrhea of unexplained                            origin Providers:                Barney Drain, MD, Rosina Lowenstein, RN, Aram Candela Referring MD:             Terald Sleeper Medicines:                Propofol per Anesthesia Complications:            No immediate complications. Estimated Blood Loss:     Estimated blood loss was minimal. Procedure:                Pre-Anesthesia Assessment:                           - Prior to the procedure, a History and Physical                            was performed, and patient medications and                            allergies were reviewed. The patient's tolerance of                            previous anesthesia was also reviewed. The risks                            and benefits of the procedure and the sedation                            options and risks were discussed with the patient.                            All questions were answered, and informed consent                            was obtained. Prior Anticoagulants: The patient has                            taken no previous anticoagulant or antiplatelet                            agents. ASA Grade Assessment: II - A patient with                            mild systemic disease. After reviewing the risks  and benefits, the patient was deemed in                            satisfactory condition to undergo the procedure.                            After obtaining informed consent, the colonoscope                            was passed under direct vision. Throughout the                            procedure, the patient's blood pressure, pulse, and                            oxygen saturations were monitored continuously. The                            Colonoscope was introduced through the anus and                            advanced to the 3 cm into the ileum. The terminal                            ileum, ileocecal valve, appendiceal orifice, and                            rectum were photographed. The colonoscopy was                            somewhat difficult due to a tortuous colon.                            Successful completion of the procedure was aided by                            COLOWRAP. The patient tolerated the procedure well.                            The quality of the bowel preparation was good. Scope In: [id number] Scope Out: 12:14:16 PM Scope Withdrawal Time: 0 hours 13 minutes 10 seconds  Total Procedure Duration: 0 hours 17 minutes 32 seconds  Findings:      Two sessile polyps were found in the transverse colon. The polyps were 3       to 5 mm in size. These polyps were removed with a cold snare. Resection       and retrieval were complete.      The recto-sigmoid colon was mildly redundant. Biopsies for histology       were taken with a cold forceps from the cecum, ascending colon,       transverse colon, descending colon and sigmoid colon for evaluation of       microscopic colitis.      External hemorrhoids were found. The hemorrhoids were small.      The  terminal ileum appeared normal. Impression:               - Two 3 to 5 mm polyps in the transverse colon,                            removed with a cold snare. Resected and retrieved.                           - Redundant LEFT colon.                           - External hemorrhoids. Moderate Sedation:      Per Anesthesia Care Recommendation:           - High fiber diet and low fat diet.                           - Continue present medications.                           - Await pathology results.                           - Repeat colonoscopy in 5-10  years for surveillance.                           - Patient has a contact number available for                            emergencies. The signs and symptoms of potential                            delayed complications were discussed with the                            patient. Return to normal activities tomorrow.                            Written discharge instructions were provided to the                            patient. Procedure Code(s):        --- Professional ---                           [phone], Colonoscopy, flexible; with removal of                            tumor(s), polyp(s), or other lesion(s) by snare                            technique                           [id number], 62, Colonoscopy, flexible; with biopsy,  single or multiple Diagnosis Code(s):        --- Professional ---                           D12.3, Benign neoplasm of transverse colon (hepatic                            flexure or splenic flexure)                           K64.4, Residual hemorrhoidal skin tags                           R19.7, Diarrhea, unspecified                           Q43.8, Other specified congenital malformations of                            intestine CPT copyright 2016 [employer]. All rights reserved. The codes documented in this report are preliminary and upon coder review may  be revised to meet current compliance requirements. Barney Drain, MD Barney Drain, MD 09/12/2016 12:53:09 PM This report has been signed electronically. Number of Addenda: 0

## 2016-09-12 NOTE — Discharge Instructions (Signed)
You had 2 polyps removed. You have small EXETERNAL hemorrhoids. 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 You have EROSIVE gastritis. I biopsied your COLON, stomach AND SMALL BOWEL.   FOLLOW A HIGH FIBER/LOW FAT DIET. AVOID ITEMS THAT CAUSE BLOATING. SEE INFO BELOW.  CONTINUE OMEPRAZOLE. TAKE 30 MINUTES PRIOR TO YOUR FIRST MEAL DAILY.  YOUR BIOPSY RESULTS WILL BE AVAILABLE IN MY CHART JAN 19 AND MY OFFICE WILL CONTACT YOU IN 10-14 DAYS WITH YOUR RESULTS.   FOLLOW UP IN 4 MOS.  Next colonoscopy in 5-10 years.  ENDOSCOPY Care After Read the instructions outlined below and refer to this sheet in the next week. These discharge instructions provide you with general information on caring for yourself after you leave the hospital. While your treatment has been planned according to the most current medical practices available, unavoidable complications occasionally occur. If you have any problems or questions after discharge, call DR. FIELDS, [phone].  ACTIVITY  You may resume your regular activity, but move at a slower pace for the next 24 hours.   Take frequent rest periods for the next 24 hours.   Walking will help get rid of the air and reduce the bloated feeling in your belly (abdomen).   No driving for 24 hours (because of the medicine (anesthesia) used during the test).   You may shower.   Do not sign any important legal documents or operate any machinery for 24 hours (because of the anesthesia used during the test).    NUTRITION  Drink plenty of fluids.   You may resume your normal diet as instructed by your doctor.   Begin with a light meal and progress to your normal diet. Heavy or fried foods are harder to digest and may make you feel sick to your stomach (nauseated).   Avoid alcoholic beverages for 24 hours or as instructed.    MEDICATIONS  You may resume your normal medications.   WHAT YOU CAN EXPECT TODAY  Some feelings of bloating in the abdomen.    Passage of more gas than usual.   Spotting of blood in your stool or on the toilet paper  .  IF YOU HAD POLYPS REMOVED DURING THE ENDOSCOPY:  Eat a soft diet IF YOU HAVE NAUSEA, BLOATING, ABDOMINAL PAIN, OR VOMITING.    FINDING OUT THE RESULTS OF YOUR TEST Not all test results are available during your visit. DR. Oneida Alar WILL CALL YOU WITHIN 14 DAYS OF YOUR PROCEDUE WITH YOUR RESULTS. Do not assume everything is normal if you have not heard from DR. FIELDS, CALL HER OFFICE AT [phone].  SEEK IMMEDIATE MEDICAL ATTENTION AND CALL THE OFFICE: [phone] IF:  You have more than a spotting of blood in your stool.   Your belly is swollen (abdominal distention).   You are nauseated or vomiting.   You have a temperature over 101F.   You have abdominal pain or discomfort that is severe or gets worse throughout the day.   Gastritis  Gastritis is an inflammation (the body's way of reacting to injury and/or infection) of the stomach. It is often caused by viral or bacterial (germ) infections. It can also be caused BY ASPIRIN, BC/GOODY POWDER'S, (IBUPROFEN) MOTRIN, OR ALEVE (NAPROXEN), chemicals (including alcohol), SPICY FOODS, and medications. This illness may be associated with generalized malaise (feeling tired, not well), UPPER ABDOMINAL STOMACH cramps, and fever. One common bacterial cause of gastritis is an organism known as H. Pylori. This can be treated with antibiotics.   High-Fiber Diet  A high-fiber diet changes your normal diet to include more whole grains, legumes, fruits, and vegetables. Changes in the diet involve replacing refined carbohydrates with unrefined foods. The calorie level of the diet is essentially unchanged. The Dietary Reference Intake (recommended amount) for adult males is 38 grams per day. For adult females, it is 25 grams per day. Pregnant and lactating women should consume 28 grams of fiber per day. Fiber is the intact part of a plant that is not broken  down during digestion. Functional fiber is fiber that has been isolated from the plant to provide a beneficial effect in the body. PURPOSE  Increase stool bulk.   Ease and regulate bowel movements.   Lower cholesterol.  REDUCE RISK OF COLON CANCER  INDICATIONS THAT YOU NEED MORE FIBER  Constipation and hemorrhoids.   Uncomplicated diverticulosis (intestine condition) and irritable bowel syndrome.   Weight management.   As a protective measure against hardening of the arteries (atherosclerosis), diabetes, and cancer.   GUIDELINES FOR INCREASING FIBER IN THE DIET  Start adding fiber to the diet slowly. A gradual increase of about 5 more grams (2 slices of whole-wheat bread, 2 servings of most fruits or vegetables, or 1 bowl of high-fiber cereal) per day is best. Too rapid an increase in fiber may result in constipation, flatulence, and bloating.   Drink enough water and fluids to keep your urine clear or pale yellow. Water, juice, or caffeine-free drinks are recommended. Not drinking enough fluid may cause constipation.   Eat a variety of high-fiber foods rather than one type of fiber.   Try to increase your intake of fiber through using high-fiber foods rather than fiber pills or supplements that contain small amounts of fiber.   The goal is to change the types of food eaten. Do not supplement your present diet with high-fiber foods, but replace foods in your present diet.   INCLUDE A VARIETY OF FIBER SOURCES  Replace refined and processed grains with whole grains, canned fruits with fresh fruits, and incorporate other fiber sources. White rice, white breads, and most bakery goods contain little or no fiber.   Brown whole-grain rice, buckwheat oats, and many fruits and vegetables are all good sources of fiber. These include: broccoli, Brussels sprouts, cabbage, cauliflower, beets, sweet potatoes, white potatoes (skin on), carrots, tomatoes, eggplant, squash, berries, fresh fruits,  and dried fruits.   Cereals appear to be the richest source of fiber. Cereal fiber is found in whole grains and bran. Bran is the fiber-rich outer coat of cereal grain, which is largely removed in refining. In whole-grain cereals, the bran remains. In breakfast cereals, the largest amount of fiber is found in those with "bran" in their names. The fiber content is sometimes indicated on the label.   You may need to include additional fruits and vegetables each day.   In baking, for 1 cup white flour, you may use the following substitutions:   1 cup whole-wheat flour minus 2 tablespoons.   1/2 cup white flour plus 1/2 cup whole-wheat flour.    Polyps, Colon  A polyp is extra tissue that grows inside your body. Colon polyps grow in the large intestine. The large intestine, also called the colon, is part of your digestive system. It is a long, hollow tube at the end of your digestive tract where your body makes and stores stool. Most polyps are not dangerous. They are benign. This means they are not cancerous. But over time, some types of polyps can  turn into cancer. Polyps that are smaller than a pea are usually not harmful. But larger polyps could someday become or may already be cancerous. To be safe, doctors remove all polyps and test them.    PREVENTION There is not one sure way to prevent polyps. You might be able to lower your risk of getting them if you:  Eat more fruits and vegetables and less fatty food.   Do not smoke.   Avoid alcohol.   Exercise every day.   Lose weight if you are overweight.   Eating more calcium and folate can also lower your risk of getting polyps. Some foods that are rich in calcium are milk, cheese, and broccoli. Some foods that are rich in folate are chickpeas, kidney beans, and spinach.   Diverticulosis Diverticulosis is a common condition that develops when small pouches (diverticula) form in the wall of the colon. The risk of diverticulosis  increases with age. It happens more often in people who eat a low-fiber diet. Most individuals with diverticulosis have no symptoms. Those individuals with symptoms usually experience belly (abdominal) pain, constipation, or loose stools (diarrhea).  HOME CARE INSTRUCTIONS  Increase the amount of fiber in your diet as directed by your caregiver or dietician. This may reduce symptoms of diverticulosis.   Drink at least 6 to 8 glasses of water each day to prevent constipation.   Try not to strain when you have a bowel movement.   Avoiding nuts and seeds to prevent complications is NOT NECESSARY.

## 2016-09-13 ENCOUNTER — Ambulatory Visit: Payer: Self-pay | Admitting: Orthopedic Surgery

## 2016-09-14 ENCOUNTER — Encounter (HOSPITAL_COMMUNITY): Payer: Self-pay | Admitting: Gastroenterology

## 2016-09-15 ENCOUNTER — Ambulatory Visit (HOSPITAL_COMMUNITY)
Admission: RE | Admit: 2016-09-15 | Discharge: 2016-09-15 | Disposition: A | Discharge status: Home / Self Care | Payer: Medicaid Other | Source: Ambulatory Visit | Attending: Pulmonary Disease | Admitting: Pulmonary Disease

## 2016-09-15 DIAGNOSIS — R0602 Shortness of breath: Secondary | ICD-10-CM | POA: Insufficient documentation

## 2016-09-15 MED ORDER — ALBUTEROL SULFATE (2.5 MG/3ML) 0.083% IN NEBU
2.5000 mg | INHALATION_SOLUTION | Freq: Once | RESPIRATORY_TRACT | Status: AC
  Administered 2016-09-15: 2.5 mg via RESPIRATORY_TRACT

## 2016-09-18 ENCOUNTER — Encounter: Payer: Self-pay | Admitting: Gastroenterology

## 2016-09-18 ENCOUNTER — Telehealth: Payer: Self-pay | Admitting: Gastroenterology

## 2016-09-18 NOTE — Telephone Encounter (Signed)
LMOM to call.

## 2016-09-18 NOTE — Telephone Encounter (Signed)
ON RECALL AND APPT MADE ° °

## 2016-09-18 NOTE — Telephone Encounter (Signed)
Tried to call with no answer  

## 2016-09-18 NOTE — Telephone Encounter (Signed)
Pt is aware.  

## 2016-09-18 NOTE — Telephone Encounter (Signed)
Please call pt. She had TWO simple adenomas removed. Her colon and small bowel biopsies are normal. HER LOOSE BOWELS ARE MOST LIKELY DUE NOT HAVING A GALLBLADDER OR NOT BEING ABLE TO TOLERATE DAIRY.   CHEW ONE TUMS WITH MEALS UP TO THREE TIMES A  DAY TO PREVENT DIARRHEA.  FOLLOW A HIGH FIBER/LOW FAT DIET. AVOID ITEMS THAT CAUSE BLOATING.   CONTINUE OMEPRAZOLE. TAKE 30 MINUTES PRIOR TO YOUR FIRST MEAL DAILY.  PLEASE CALL IN ONE MONTH IF DIARRHEA IS NOT IMPROVED.   FOLLOW UP IN 4 MOS E30 DIARRHEA/DYSPHAGIA.  Next colonoscopy in 5-10 years.

## 2016-09-22 ENCOUNTER — Ambulatory Visit (INDEPENDENT_AMBULATORY_CARE_PROVIDER_SITE_OTHER): Payer: Medicaid Other | Admitting: Orthopedic Surgery

## 2016-09-22 ENCOUNTER — Ambulatory Visit (INDEPENDENT_AMBULATORY_CARE_PROVIDER_SITE_OTHER): Payer: Medicaid Other

## 2016-09-22 VITALS — BP 146/96 | HR 88 | Ht 66.0 in | Wt 170.0 lb

## 2016-09-22 DIAGNOSIS — M47812 Spondylosis without myelopathy or radiculopathy, cervical region: Secondary | ICD-10-CM | POA: Diagnosis not present

## 2016-09-22 DIAGNOSIS — M542 Cervicalgia: Secondary | ICD-10-CM

## 2016-09-22 DIAGNOSIS — M7711 Lateral epicondylitis, right elbow: Secondary | ICD-10-CM | POA: Diagnosis not present

## 2016-09-22 DIAGNOSIS — M545 Low back pain: Secondary | ICD-10-CM | POA: Diagnosis not present

## 2016-09-22 NOTE — Patient Instructions (Addendum)
Physical therapy for neck and back   Do these exercises for your elbow : Tennis Elbow Rehab Ask your health care provider which exercises are safe for you. Do exercises exactly as told by your health care provider and adjust them as directed. It is normal to feel mild stretching, pulling, tightness, or discomfort as you do these exercises, but you should stop right away if you feel sudden pain or your pain gets worse. Do not begin these exercises until told by your health care provider. Stretching and range of motion exercises  Do each exercise 10 times hold for 1 count do once a day  These exercises warm up your muscles and joints and improve the movement and flexibility of your elbow. These exercises also help to relieve pain, numbness, and tingling. Exercise A: Wrist extensor stretch 1. Extend your left / right elbow with your fingers pointing down. 2. Gently pull the palm of your left / right hand toward you until you feel a gentle stretch on the top of your forearm. 3. To increase the stretch, push your left / right hand toward the outer edge or pinkie side of your forearm. 4. Hold this position for __________ seconds. Repeat __________ times. Complete this exercise __________ times a day. If directed by your health care provider, repeat this stretch except do it with a bent elbow this time. Exercise B: Wrist flexor stretch 1. Extend your left / right elbow and turn your palm upward. 2. Gently pull your left / right palm and fingertips back so your wrist extends and your fingers point more toward the ground. 3. You should feel a gentle stretch on the inside of your forearm. 4. Hold this position for __________ seconds. Repeat __________ times. Complete this exercise __________ times a day. If directed by your health care provider, repeat this stretch except do it with a bent elbow this time. Strengthening exercises These exercises build strength and endurance in your elbow. Endurance is  the ability to use your muscles for a long time, even after they get tired. Exercise C: Wrist extensors 1. Sit with your left / right forearm palm-down and fully supported on a table or countertop. Your elbow should be resting below the height of your shoulder. 2. Let your left / right wrist extend over the edge of the surface. 3. Loosely hold a __________ weight or a piece of rubber exercise band or tubing in your left / right hand. Slowly curl your left / right hand up toward your forearm. If you are using band or tubing, hold the band or tubing in place with your other hand to provide resistance. 4. Hold this position for __________ seconds. 5. Slowly return to the starting position. Repeat __________ times. Complete this exercise __________ times a day. Exercise D: Radial deviators 1. Stand with a __________ weight in your left / righthand. Or, sit while holding a rubber exercise band or tubing with your other arm supported on a table or countertop. Position your hand so your thumb is on top. 2. Raise your hand upward in front of you so your thumb travels toward your forearm, or pull up on the rubber tubing. 3. Hold this position for __________ seconds. 4. Slowly return to the starting position. Repeat __________ times. Complete this exercise __________ times a day. Exercise E: Eccentric wrist extensors 1. Sit with your left / right forearm palm-down and fully supported on a table or countertop. Your elbow should be resting below the height of your shoulder.  2. If told by your health care provider, hold a __________ weight in your hand. 3. Let your left / right wrist extend over the edge of the surface. 4. Use your other hand to lift up your left / right hand toward your forearm. Keep your forearm on the table. 5. Using only the muscles in your left / right hand, slowly lower your hand back down to the starting position. Repeat __________ times. Complete this exercise __________ times a  day. This information is not intended to replace advice given to you by your health care provider. Make sure you discuss any questions you have with your health care provider. Document Released: 08/14/2005 Document Revised: 04/19/2016 Document Reviewed: 05/13/2015 [employer] Interactive Patient Education  2017 [employer].
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
## 2016-10-02 ENCOUNTER — Other Ambulatory Visit (HOSPITAL_BASED_OUTPATIENT_CLINIC_OR_DEPARTMENT_OTHER): Payer: Self-pay

## 2016-10-02 DIAGNOSIS — G4733 Obstructive sleep apnea (adult) (pediatric): Secondary | ICD-10-CM

## 2016-10-03 ENCOUNTER — Ambulatory Visit: Payer: Medicaid Other | Attending: Orthopedic Surgery | Admitting: Physical Therapy

## 2016-10-03 ENCOUNTER — Encounter: Payer: Self-pay | Admitting: Physical Therapy

## 2016-10-03 DIAGNOSIS — G8929 Other chronic pain: Secondary | ICD-10-CM

## 2016-10-03 DIAGNOSIS — M5442 Lumbago with sciatica, left side: Secondary | ICD-10-CM | POA: Diagnosis not present

## 2016-10-03 DIAGNOSIS — M542 Cervicalgia: Secondary | ICD-10-CM | POA: Diagnosis present

## 2016-10-03 NOTE — Patient Instructions (Addendum)
  Flexibility: Upper Trapezius Stretch   Gently grasp right side of head while reaching behind back with other hand. Tilt head away until a gentle stretch is felt. Hold 30 seconds. Repeat 3 times per set. Do 2 sessions per day.  http://[email or web address].us/340   Levator Stretch   Grasp seat or sit on hand on side to be stretched. Turn head toward other side and look down. Use hand on head to gently stretch neck in that position. Hold _30___ seconds. Repeat on other side. Repeat 3 times. Do 2 sessions per day.  [email or web address].[email or web address]   Scapular Retraction (Standing)   With arms at sides, pinch shoulder blades together. Repeat 10 times per set. Do 1-3 sets per session. Do 2 sessions per day.  http://orth.exer.us/944     Flexibility: Neck Retraction   Pull head straight back, keeping eyes and jaw level. Hold 3-5 seconds. Repeat _10 times per set. Do 3-5  sessions per day.  http://[email or web address].us/344   Posture - Sitting   Sit upright, head facing forward. Try using a roll to support lower back. Keep shoulders relaxed, and avoid rounded back. Keep hips level with knees. Avoid crossing legs for long periods.   Wall Lean Stretch   With left hand against wall, slowly stretch hips toward wall, other arm supporting trunk. Hold _5___ seconds. Relax. Repeat _10___ times per set. Do _2___ sets per session. Do _2___ sessions per day.  You can hold for 30 seconds on last rep for prolonged stretch.  Thoracic Self-Mobilization Stretch (Supine)   With small rolled towel at lower ribs level, gently lie back until stretch is felt. Hold _30-60___ seconds. Relax. Repeat __3__ times per set. Do ____ sets per session. Do 1____ sessions per day.  http://orth.exer.us/994   Copyright  VHI. All rights reserved.     Madelyn Flavors, PT 10/03/16 10:16 AM [facility] [place], [place], [address] Phone: [phone]   Fax:  [phone]
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
## 2016-10-06 ENCOUNTER — Ambulatory Visit: Payer: Medicaid Other | Admitting: Family Medicine

## 2016-10-06 ENCOUNTER — Ambulatory Visit: Payer: Medicaid Other | Admitting: Physician Assistant

## 2016-10-15 ENCOUNTER — Ambulatory Visit: Payer: Medicaid Other | Attending: Neurology | Admitting: Neurology

## 2016-10-15 DIAGNOSIS — G4733 Obstructive sleep apnea (adult) (pediatric): Secondary | ICD-10-CM | POA: Diagnosis present

## 2016-10-21 NOTE — Procedures (Signed)
  [place] A. Merlene Laughter, MD     [email or web address]             NOCTURNAL POLYSOMNOGRAPHY   LOCATION: [facility]  Patient Name: Nancy Delacruz, Nancy Delacruz Date: 10/15/2016 Gender: Female D.O.B: Apr 30, 1961 Age (years): 43 Referring Provider: Barton Fanny NP Height (inches): 65 Interpreting Physician: Phillips Odor MD, ABSM Weight (lbs): 170 RPSGT: Rosebud Poles BMI: 28 MRN: [id number] Neck Size: 16.50 CLINICAL INFORMATION Sleep Study Type: NPSG  Indication for sleep study: N/A  Epworth Sleepiness Score:  SLEEP STUDY TECHNIQUE As per the AASM Manual for the Scoring of Sleep and Associated Events v2.3 (April 2016) with a hypopnea requiring 4% desaturations.  The channels recorded and monitored were frontal, central and occipital EEG, electrooculogram (EOG), submentalis EMG (chin), nasal and oral airflow, thoracic and abdominal wall motion, anterior tibialis EMG, snore microphone, electrocardiogram, and pulse oximetry.  MEDICATIONS Medications self-administered by patient taken the night of the study : N/A  Current Outpatient Prescriptions:  .  ARIPiprazole (ABILIFY) 5 MG tablet, Take 1 tablet (5 mg total) by mouth daily., Disp: 30 tablet, Rfl: 1 .  Aspirin-Acetaminophen-Caffeine (GOODY HEADACHE PO), Take 2 packets by mouth daily as needed (headaches)., Disp: , Rfl:  .  cyclobenzaprine (FLEXERIL) 10 MG tablet, Take 10 mg by mouth 3 (three) times daily as needed for muscle spasms., Disp: , Rfl:  .  DULoxetine (CYMBALTA) 20 MG capsule, Take 20 mg by mouth daily., Disp: , Rfl:  .  meloxicam (MOBIC) 7.5 MG tablet, Take 1 tablet (7.5 mg total) by mouth daily. For inflammation, Disp: 30 tablet, Rfl: 2 .  omeprazole (PRILOSEC) 20 MG capsule, Take 1 capsule (20 mg total) by mouth daily., Disp: 30 capsule, Rfl: 6 .  promethazine (PHENERGAN) 25 MG tablet, Take 25 mg by mouth every 8 (eight) hours as needed for nausea/vomiting., Disp: , Rfl: 0 .  SUMAtriptan (IMITREX) 100  MG tablet, Take 1 tablet (100 mg total) by mouth every 2 (two) hours as needed for migraine. May repeat in 2 hours if headache persists or recurs., Disp: 10 tablet, Rfl: 2   SLEEP ARCHITECTURE The study was initiated at 10:09:11 PM and ended at 4:39:55 AM.  Sleep onset time was 20.2 minutes and the sleep efficiency was 54.9%. The total sleep time was 214.5 minutes.  Stage REM latency was 193.0 minutes.  The patient spent 13.75% of the night in stage N1 sleep, 57.34% in stage N2 sleep, 13.75% in stage N3 and 15.15% in REM.  Alpha intrusion was absent.  Supine sleep was 0.00%.  RESPIRATORY PARAMETERS The overall apnea/hypopnea index (AHI) was 6.2 per hour. There were 10 total apneas, including 9 obstructive, 0 central and 1 mixed apneas. There were 12 hypopneas and 9 RERAs.  The AHI during Stage REM sleep was 24.0 per hour.  AHI while supine was N/A per hour.  The mean oxygen saturation was 94.22%. The minimum SpO2 during sleep was 90.00%.  Moderate snoring was noted during this study.  CARDIAC DATA The 2 lead EKG demonstrated sinus rhythm. The mean heart rate was N/A beats per minute. Other EKG findings include: None. LEG MOVEMENT DATA The total PLMS were 0 with a resulting PLMS index of 0.00. Associated arousal with leg movement index was 0.0.  IMPRESSIONS - Mild obstructive sleep apnea not requiring positive pressure treatment.    Delano Metz, MD Diplomate, American Board of Sleep Medicine.

## 2016-12-05 ENCOUNTER — Encounter: Payer: Medicaid Other | Admitting: Physician Assistant

## 2016-12-08 ENCOUNTER — Ambulatory Visit (INDEPENDENT_AMBULATORY_CARE_PROVIDER_SITE_OTHER): Payer: Medicaid Other | Admitting: Physician Assistant

## 2016-12-08 ENCOUNTER — Encounter: Payer: Self-pay | Admitting: Physician Assistant

## 2016-12-08 VITALS — BP 138/83 | HR 101 | Temp 97.3°F | Ht 66.0 in | Wt 176.8 lb

## 2016-12-08 DIAGNOSIS — K2100 Gastro-esophageal reflux disease with esophagitis, without bleeding: Secondary | ICD-10-CM

## 2016-12-08 DIAGNOSIS — F321 Major depressive disorder, single episode, moderate: Secondary | ICD-10-CM

## 2016-12-08 DIAGNOSIS — F411 Generalized anxiety disorder: Secondary | ICD-10-CM | POA: Diagnosis not present

## 2016-12-08 DIAGNOSIS — F39 Unspecified mood [affective] disorder: Secondary | ICD-10-CM | POA: Diagnosis not present

## 2016-12-08 MED ORDER — HYDROXYZINE PAMOATE 25 MG PO CAPS: 25.0000 mg | ORAL_CAPSULE | Freq: Three times a day (TID) | ORAL | 1 refills | Status: DC | PRN

## 2016-12-08 MED ORDER — BUPROPION HCL ER (SR) 150 MG PO TB12: 150.0000 mg | ORAL_TABLET | Freq: Two times a day (BID) | ORAL | 1 refills | Status: DC

## 2016-12-08 MED ORDER — BUPROPION HCL ER (SR) 150 MG PO TB12: 150.0000 mg | ORAL_TABLET | Freq: Every day | ORAL | 1 refills | Status: DC

## 2016-12-08 NOTE — Progress Notes (Signed)
BP 138/83   Pulse (!) 101   Temp 97.3 F (36.3 C) (Oral)   Ht 5\' 6"  (1.676 m)   Wt 176 lb 12.8 oz (80.2 kg)   BMI 28.54 kg/m    Subjective:    Patient ID: Nancy Delacruz, female    DOB: September 21, 1960, 56 y.o.   MRN: [id number]  HPI: Nancy Delacruz is a 56 y.o. female presenting on 12/08/2016 for Follow-up (3 month )  This patient comes in for periodic recheck on medications and conditions including DDD, depression, anxiety.   All medications are reviewed today. There are no reports of any problems with the medications. All of the medical conditions are reviewed and updated.  Lab work is reviewed and will be ordered as medically necessary. There are no new problems reported with today's visit.   Relevant past medical, surgical, family and social history reviewed and updated as indicated. Allergies and medications reviewed and updated.  Past Medical History:  Diagnosis Date  . Anxiety   . Bipolar 1 disorder ([place])   . Depression   . History of kidney stones     Past Surgical History:  Procedure Laterality Date  . BIOPSY  09/12/2016   Procedure: BIOPSY;  Surgeon: Danie Binder, MD;  [facility];  Service: Endoscopy;;  random colon bx's, duodenal bx's, gastric bx's  . CHOLECYSTECTOMY    . COLONOSCOPY WITH PROPOFOL N/A 09/12/2016   Procedure: COLONOSCOPY WITH PROPOFOL;  Surgeon: Danie Binder, MD;  [facility];  Service: Endoscopy;  Laterality: N/A;  12:30 pm  . ESOPHAGOGASTRODUODENOSCOPY (EGD) WITH PROPOFOL N/A 09/12/2016   Procedure: ESOPHAGOGASTRODUODENOSCOPY (EGD) WITH PROPOFOL;  Surgeon: Danie Binder, MD;  [facility];  Service: Endoscopy;  Laterality: N/A;  . HAND SURGERY     left, tendon  . POLYPECTOMY  09/12/2016   Procedure: POLYPECTOMY;  Surgeon: Danie Binder, MD;  [facility];  Service: Endoscopy;;  transverse colon polyps x2  . SAVORY DILATION N/A 09/12/2016   Procedure: SAVORY DILATION;  Surgeon: Danie Binder, MD;   [facility];  Service: Endoscopy;  Laterality: N/A;    Review of Systems  Constitutional: Negative.  Negative for activity change, fatigue and fever.  HENT: Negative.   Eyes: Negative.   Respiratory: Negative.  Negative for cough.   Cardiovascular: Negative.  Negative for chest pain.  Gastrointestinal: Negative.  Negative for abdominal pain.  Endocrine: Negative.   Genitourinary: Negative.  Negative for dysuria.  Musculoskeletal: Positive for arthralgias and back pain.  Skin: Negative.   Neurological: Negative.   Psychiatric/Behavioral: Positive for decreased concentration and sleep disturbance. Negative for self-injury and suicidal ideas. The patient is nervous/anxious.     Allergies as of 12/08/2016      Reactions   Keflex [cephalexin] Rash      Medication List       Accurate as of 12/08/16 11:04 AM. Always use your most recent med list.          ARIPiprazole 5 MG tablet Commonly known as:  ABILIFY Take 1 tablet (5 mg total) by mouth daily.   buPROPion 150 MG 12 hr tablet Commonly known as:  WELLBUTRIN SR Take 1-2 tablets (150-300 mg total) by mouth daily.   cyclobenzaprine 10 MG tablet Commonly known as:  FLEXERIL Take 10 mg by mouth 3 (three) times daily as needed for muscle spasms.   doxepin 25 MG capsule Commonly known as:  SINEQUAN TAKE ONE (1) CAPSULE  BY MOUTH AT BEDTIME FOR SLEEP   GOODY HEADACHE PO Take 2 packets by mouth daily as needed (headaches).   hydrOXYzine 25 MG capsule Commonly known as:  VISTARIL Take 1 capsule (25 mg total) by mouth every 8 (eight) hours as needed for anxiety.   meloxicam 7.5 MG tablet Commonly known as:  MOBIC Take 1 tablet (7.5 mg total) by mouth daily. For inflammation   omeprazole 20 MG capsule Commonly known as:  PRILOSEC Take 1 capsule (20 mg total) by mouth daily.   PROAIR HFA 108 (90 Base) MCG/ACT inhaler Generic drug:  albuterol USE 2 PUFFS 4 TIMES A DAY AS NEEDED   promethazine 25 MG  tablet Commonly known as:  PHENERGAN Take 25 mg by mouth every 8 (eight) hours as needed for nausea/vomiting.   SPIRIVA HANDIHALER 18 MCG inhalation capsule Generic drug:  tiotropium USE 1 PUFF EVERY DAY   SUMAtriptan 100 MG tablet Commonly known as:  IMITREX Take 1 tablet (100 mg total) by mouth every 2 (two) hours as needed for migraine. May repeat in 2 hours if headache persists or recurs.   topiramate 25 MG tablet Commonly known as:  TOPAMAX TAKE 1 AT BEDTIME X 2 WEEKS, THEN INCREASE TO 2 AT BEDTIME AS TOLERATED   Vitamin D (Ergocalciferol) 50000 units Caps capsule Commonly known as:  DRISDOL Take 50,000 Units by mouth once a week.          Objective:    BP 138/83   Pulse (!) 101   Temp 97.3 F (36.3 C) (Oral)   Ht 5\' 6"  (1.676 m)   Wt 176 lb 12.8 oz (80.2 kg)   BMI 28.54 kg/m   Allergies  Allergen Reactions  . Keflex [Cephalexin] Rash    Physical Exam  Constitutional: She is oriented to person, place, and time. She appears well-developed and well-nourished.  HENT:  Head: Normocephalic and atraumatic.  Right Ear: Tympanic membrane, external ear and ear canal normal.  Left Ear: Tympanic membrane, external ear and ear canal normal.  Nose: Nose normal. No rhinorrhea.  Mouth/Throat: Oropharynx is clear and moist and mucous membranes are normal. No oropharyngeal exudate or posterior oropharyngeal erythema.  Eyes: Conjunctivae and EOM are normal. Pupils are equal, round, and reactive to light.  Neck: Normal range of motion. Neck supple.  Cardiovascular: Normal rate, regular rhythm, normal heart sounds and intact distal pulses.   Pulmonary/Chest: Effort normal and breath sounds normal.  Abdominal: Soft. Bowel sounds are normal.  Neurological: She is alert and oriented to person, place, and time. She has normal reflexes.  Skin: Skin is warm and dry. No rash noted.  Psychiatric: She has a normal mood and affect. Her behavior is normal. Judgment and thought content  normal.  Nursing note and vitals reviewed.       Assessment & Plan:   1. Depression, major, single episode, moderate (HCC) - buPROPion (WELLBUTRIN SR) 150 MG 12 hr tablet; Take 1-2 tablets (150-300 mg total) by mouth daily.  Dispense: 60 tablet; Refill: 1  2. Gastroesophageal reflux disease with esophagitis  3. Episodic mood disorder ([place])  4. Generalized anxiety disorder - buPROPion (WELLBUTRIN SR) 150 MG 12 hr tablet; Take 1-2 tablets (150-300 mg total) by mouth daily.  Dispense: 60 tablet; Refill: 1 - hydrOXYzine (VISTARIL) 25 MG capsule; Take 1 capsule (25 mg total) by mouth every 8 (eight) hours as needed for anxiety.  Dispense: 90 capsule; Refill: 1   Current Outpatient Prescriptions:  .  ARIPiprazole (ABILIFY) 5 MG tablet,  Take 1 tablet (5 mg total) by mouth daily., Disp: 30 tablet, Rfl: 1 .  Aspirin-Acetaminophen-Caffeine (GOODY HEADACHE PO), Take 2 packets by mouth daily as needed (headaches)., Disp: , Rfl:  .  cyclobenzaprine (FLEXERIL) 10 MG tablet, Take 10 mg by mouth 3 (three) times daily as needed for muscle spasms., Disp: , Rfl:  .  doxepin (SINEQUAN) 25 MG capsule, TAKE ONE (1) CAPSULE BY MOUTH AT BEDTIME FOR SLEEP, Disp: , Rfl: 2 .  meloxicam (MOBIC) 7.5 MG tablet, Take 1 tablet (7.5 mg total) by mouth daily. For inflammation, Disp: 30 tablet, Rfl: 2 .  omeprazole (PRILOSEC) 20 MG capsule, Take 1 capsule (20 mg total) by mouth daily., Disp: 30 capsule, Rfl: 6 .  PROAIR HFA 108 (90 Base) MCG/ACT inhaler, USE 2 PUFFS 4 TIMES A DAY AS NEEDED, Disp: , Rfl: 12 .  promethazine (PHENERGAN) 25 MG tablet, Take 25 mg by mouth every 8 (eight) hours as needed for nausea/vomiting., Disp: , Rfl: 0 .  SPIRIVA HANDIHALER 18 MCG inhalation capsule, USE 1 PUFF EVERY DAY, Disp: , Rfl: 12 .  SUMAtriptan (IMITREX) 100 MG tablet, Take 1 tablet (100 mg total) by mouth every 2 (two) hours as needed for migraine. May repeat in 2 hours if headache persists or recurs., Disp: 10 tablet, Rfl: 2 .   topiramate (TOPAMAX) 25 MG tablet, TAKE 1 AT BEDTIME X 2 WEEKS, THEN INCREASE TO 2 AT BEDTIME AS TOLERATED, Disp: , Rfl: 0 .  Vitamin D, Ergocalciferol, (DRISDOL) 50000 units CAPS capsule, Take 50,000 Units by mouth once a week., Disp: , Rfl: 1 .  buPROPion (WELLBUTRIN SR) 150 MG 12 hr tablet, Take 1-2 tablets (150-300 mg total) by mouth daily., Disp: 60 tablet, Rfl: 1 .  hydrOXYzine (VISTARIL) 25 MG capsule, Take 1 capsule (25 mg total) by mouth every 8 (eight) hours as needed for anxiety., Disp: 90 capsule, Rfl: 1  Continue all other maintenance medications as listed above.  Follow up plan: Return in about 4 weeks (around 01/05/2017) for recheck meds.  Educational handout given for GAD  Terald Sleeper PA-C [facility] [address]  [place], [place] [address] [phone]   12/08/2016, 11:04 AM

## 2016-12-08 NOTE — Patient Instructions (Signed)
Generalized Anxiety Disorder, Adult Generalized anxiety disorder (GAD) is a mental health disorder. People with this condition constantly worry about everyday events. Unlike normal anxiety, worry related to GAD is not triggered by a specific event. These worries also do not fade or get better with time. GAD interferes with life functions, including relationships, work, and school. GAD can vary from mild to severe. People with severe GAD can have intense waves of anxiety with physical symptoms (panic attacks). What are the causes? The exact cause of GAD is not known. What increases the risk? This condition is more likely to develop in:  Women.  People who have a family history of anxiety disorders.  People who are very shy.  People who experience very stressful life events, such as the death of a loved one.  People who have a very stressful family environment. What are the signs or symptoms? People with GAD often worry excessively about many things in their lives, such as their health and family. They may also be overly concerned about:  Doing well at work.  Being on time.  Natural disasters.  Friendships. Physical symptoms of GAD include:  Fatigue.  Muscle tension or having muscle twitches.  Trembling or feeling shaky.  Being easily startled.  Feeling like your heart is pounding or racing.  Feeling out of breath or like you cannot take a deep breath.  Having trouble falling asleep or staying asleep.  Sweating.  Nausea, diarrhea, or irritable bowel syndrome (IBS).  Headaches.  Trouble concentrating or remembering facts.  Restlessness.  Irritability. How is this diagnosed? Your health care provider can diagnose GAD based on your symptoms and medical history. You will also have a physical exam. The health care provider will ask specific questions about your symptoms, including how severe they are, when they started, and if they come and go. Your health care  provider may ask you about your use of alcohol or drugs, including prescription medicines. Your health care provider may refer you to a mental health specialist for further evaluation. Your health care provider will do a thorough examination and may perform additional tests to rule out other possible causes of your symptoms. To be diagnosed with GAD, a person must have anxiety that:  Is out of his or her control.  Affects several different aspects of his or her life, such as work and relationships.  Causes distress that makes him or her unable to take part in normal activities.  Includes at least three physical symptoms of GAD, such as restlessness, fatigue, trouble concentrating, irritability, muscle tension, or sleep problems. Before your health care provider can confirm a diagnosis of GAD, these symptoms must be present more days than they are not, and they must last for six months or longer. How is this treated? The following therapies are usually used to treat GAD:  Medicine. Antidepressant medicine is usually prescribed for long-term daily control. Antianxiety medicines may be added in severe cases, especially when panic attacks occur.  Talk therapy (psychotherapy). Certain types of talk therapy can be helpful in treating GAD by providing support, education, and guidance. Options include:  Cognitive behavioral therapy (CBT). People learn coping skills and techniques to ease their anxiety. They learn to identify unrealistic or negative thoughts and behaviors and to replace them with positive ones.  Acceptance and commitment therapy (ACT). This treatment teaches people how to be mindful as a way to cope with unwanted thoughts and feelings.  Biofeedback. This process trains you to manage your body's response (  physiological response) through breathing techniques and relaxation methods. You will work with a therapist while machines are used to monitor your physical symptoms.  Stress  management techniques. These include yoga, meditation, and exercise. A mental health specialist can help determine which treatment is best for you. Some people see improvement with one type of therapy. However, other people require a combination of therapies. Follow these instructions at home:  Take over-the-counter and prescription medicines only as told by your health care provider.  Try to maintain a normal routine.  Try to anticipate stressful situations and allow extra time to manage them.  Practice any stress management or self-calming techniques as taught by your health care provider.  Do not punish yourself for setbacks or for not making progress.  Try to recognize your accomplishments, even if they are small.  Keep all follow-up visits as told by your health care provider. This is important. Contact a health care provider if:  Your symptoms do not get better.  Your symptoms get worse.  You have signs of depression, such as:  A persistently sad, cranky, or irritable mood.  Loss of enjoyment in activities that used to bring you joy.  Change in weight or eating.  Changes in sleeping habits.  Avoiding friends or family members.  Loss of energy for normal tasks.  Feelings of guilt or worthlessness. Get help right away if:  You have serious thoughts about hurting yourself or others. If you ever feel like you may hurt yourself or others, or have thoughts about taking your own life, get help right away. You can go to your nearest emergency department or call:  Your local emergency services (911 in the [place]  A suicide crisis helpline, such as the National Suicide Prevention Lifeline at 1-800-273-8255. This is open 24 hours a day. Summary  Generalized anxiety disorder (GAD) is a mental health disorder that involves worry that is not triggered by a specific event.  People with GAD often worry excessively about many things in their lives, such as their health and  family.  GAD may cause physical symptoms such as restlessness, trouble concentrating, sleep problems, frequent sweating, nausea, diarrhea, headaches, and trembling or muscle twitching.  A mental health specialist can help determine which treatment is best for you. Some people see improvement with one type of therapy. However, other people require a combination of therapies. This information is not intended to replace advice given to you by your health care provider. Make sure you discuss any questions you have with your health care provider. Document Released: 12/09/2012 Document Revised: 07/04/2016 Document Reviewed: 07/04/2016 Elsevier Interactive Patient Education  2017 Elsevier Inc.  

## 2016-12-20 LAB — PULMONARY FUNCTION TEST
DL/VA % pred: 68 %
DL/VA: 3.45 ml/min/mmHg/L
DLCO UNC: 14.77 ml/min/mmHg
DLCO unc % pred: 54 %
FEF 25-75 Post: 2.57 L/sec
FEF 25-75 Pre: 2.36 L/sec
FEF2575-%Change-Post: 8 %
FEF2575-%Pred-Post: 99 %
FEF2575-%Pred-Pre: 90 %
FEV1-%CHANGE-POST: 2 %
FEV1-%PRED-POST: 83 %
FEV1-%PRED-PRE: 81 %
FEV1-POST: 2.38 L
FEV1-Pre: 2.32 L
FEV1FVC-%Change-Post: -2 %
FEV1FVC-%Pred-Pre: 103 %
FEV6-%Change-Post: 4 %
FEV6-%PRED-POST: 84 %
FEV6-%Pred-Pre: 80 %
FEV6-PRE: 2.84 L
FEV6-Post: 2.97 L
FEV6FVC-%CHANGE-POST: 0 %
FEV6FVC-%PRED-POST: 102 %
FEV6FVC-%PRED-PRE: 103 %
FVC-%Change-Post: 4 %
FVC-%Pred-Post: 81 %
FVC-%Pred-Pre: 77 %
FVC-Post: 2.97 L
FVC-Pre: 2.84 L
PRE FEV6/FVC RATIO: 100 %
Post FEV1/FVC ratio: 80 %
Post FEV6/FVC ratio: 100 %
Pre FEV1/FVC ratio: 82 %
RV % PRED: 77 %
RV: 1.57 L
TLC % PRED: 84 %
TLC: 4.5 L

## 2016-12-21 ENCOUNTER — Emergency Department (HOSPITAL_COMMUNITY): Payer: Medicaid Other

## 2016-12-21 ENCOUNTER — Encounter (HOSPITAL_COMMUNITY): Payer: Self-pay | Admitting: Emergency Medicine

## 2016-12-21 ENCOUNTER — Emergency Department (HOSPITAL_COMMUNITY)
Admission: EM | Admit: 2016-12-21 | Discharge: 2016-12-21 | Disposition: A | Discharge status: Home / Self Care | Payer: Medicaid Other | Attending: Emergency Medicine | Admitting: Emergency Medicine

## 2016-12-21 DIAGNOSIS — R1084 Generalized abdominal pain: Secondary | ICD-10-CM | POA: Insufficient documentation

## 2016-12-21 DIAGNOSIS — F17210 Nicotine dependence, cigarettes, uncomplicated: Secondary | ICD-10-CM | POA: Diagnosis not present

## 2016-12-21 DIAGNOSIS — Z79899 Other long term (current) drug therapy: Secondary | ICD-10-CM | POA: Insufficient documentation

## 2016-12-21 LAB — CBC
HCT: 38.6 % (ref 36.0–46.0)
Hemoglobin: 13.1 g/dL (ref 12.0–15.0)
MCH: 30 pg (ref 26.0–34.0)
MCHC: 33.9 g/dL (ref 30.0–36.0)
MCV: 88.3 fL (ref 78.0–100.0)
PLATELETS: 333 10*3/uL (ref 150–400)
RBC: 4.37 MIL/uL (ref 3.87–5.11)
RDW: 12.8 % (ref 11.5–15.5)
WBC: 7.9 10*3/uL (ref 4.0–10.5)

## 2016-12-21 LAB — COMPREHENSIVE METABOLIC PANEL
ALK PHOS: 77 U/L (ref 38–126)
ALT: 18 U/L (ref 14–54)
AST: 23 U/L (ref 15–41)
Albumin: 4.5 g/dL (ref 3.5–5.0)
Anion gap: 9 (ref 5–15)
BILIRUBIN TOTAL: 0.4 mg/dL (ref 0.3–1.2)
BUN: 9 mg/dL (ref 6–20)
CHLORIDE: 104 mmol/L (ref 101–111)
CO2: 24 mmol/L (ref 22–32)
CREATININE: 0.85 mg/dL (ref 0.44–1.00)
Calcium: 9.1 mg/dL (ref 8.9–10.3)
GFR calc Af Amer: >60 mL/min (ref >60)
Glucose, Bld: 100 mg/dL — ABNORMAL HIGH (ref 65–99)
Potassium: 3.3 mmol/L — ABNORMAL LOW (ref 3.5–5.1)
Sodium: 137 mmol/L (ref 135–145)
Total Protein: 7.9 g/dL (ref 6.5–8.1)

## 2016-12-21 LAB — LIPASE, BLOOD: LIPASE: 37 U/L (ref 11–51)

## 2016-12-21 MED ORDER — SUCRALFATE 1 GM/10ML PO SUSP: 1.0000 g | Freq: Three times a day (TID) | ORAL | 0 refills | Status: DC

## 2016-12-21 MED ORDER — DICYCLOMINE HCL 20 MG PO TABS: 20.0000 mg | ORAL_TABLET | Freq: Two times a day (BID) | ORAL | 0 refills | Status: DC

## 2016-12-21 NOTE — Discharge Instructions (Signed)

## 2016-12-21 NOTE — ED Provider Notes (Signed)
Emergency Department Provider Note  By signing my name below, I, Hilbert Odor, attest that this documentation has been prepared under the direction and in the presence of Margette Fast, MD. Electronically Signed: Hilbert Odor, Scribe. 12/21/16. 11:46 AM.   I have reviewed the triage vital signs and the nursing notes.   HISTORY  Chief Complaint Abdominal Pain   HPI Comments: Nancy Delacruz is a 56 y.o. female with hx of COPD, depression, and anxiety who presents to the Emergency Department complaining of worsening right sided abdominal pain with distension for the past 2 months. She states that her abdomen has been distended for the past year. She was seen at [facility] about 2 months ago and was diagnosed with constipation. She has an appointment in May of 2018 with her gastroenterologist but she was convinced by a friend to come get evaluated prior to that appointment. The patient also reports some nausea recently but no vomiting. She states that she has a bowel movement daily. She denies hx of liver problems. She had her gall bladder removed. She denies vomiting, fevers, blood in stool, or any urinary symptoms.  Past Medical History:  Diagnosis Date  . Anxiety   . Bipolar 1 disorder ([place])   . Depression   . History of kidney stones     Patient Active Problem List   Diagnosis Date Noted  . Depression, major, single episode, moderate ([place]) 12/08/2016  . Chronic tension-type headache, not intractable 09/08/2016  . Episodic mood disorder ([place]) 09/08/2016  . Diarrhea 09/04/2016  . Dysphagia 09/04/2016  . Major depressive disorder, recurrent episode, moderate ([place]) 08/01/2016  . Gastroesophageal reflux disease with esophagitis 08/01/2016  . Generalized anxiety disorder 08/01/2016  . Notalgia 08/01/2016  . Lateral epicondylitis of right elbow 08/01/2016  . COPD exacerbation ([place]) 08/01/2016    Past Surgical History:  Procedure Laterality Date  . BIOPSY   09/12/2016   Procedure: BIOPSY;  Surgeon: Danie Binder, MD;  [facility];  Service: Endoscopy;;  random colon bx's, duodenal bx's, gastric bx's  . CHOLECYSTECTOMY    . COLONOSCOPY WITH PROPOFOL N/A 09/12/2016   Procedure: COLONOSCOPY WITH PROPOFOL;  Surgeon: Danie Binder, MD;  [facility];  Service: Endoscopy;  Laterality: N/A;  12:30 pm  . ESOPHAGOGASTRODUODENOSCOPY (EGD) WITH PROPOFOL N/A 09/12/2016   Procedure: ESOPHAGOGASTRODUODENOSCOPY (EGD) WITH PROPOFOL;  Surgeon: Danie Binder, MD;  [facility];  Service: Endoscopy;  Laterality: N/A;  . HAND SURGERY     left, tendon  . POLYPECTOMY  09/12/2016   Procedure: POLYPECTOMY;  Surgeon: Danie Binder, MD;  [facility];  Service: Endoscopy;;  transverse colon polyps x2  . SAVORY DILATION N/A 09/12/2016   Procedure: SAVORY DILATION;  Surgeon: Danie Binder, MD;  [facility];  Service: Endoscopy;  Laterality: N/A;    Current Outpatient Rx  . Order #: [id number] Class: Historical Med  . Order #: [id number] Class: Historical Med  . Order #: [id number] Class: Normal  . Order #: [id number] Class: Normal  . Order #: [id number] Class: Historical Med  . Order #: [id number] Class: Historical Med  . Order #: [id number] Class: Historical Med  . Order #: [id number] Class: Normal  . Order #: [id number] Class: Normal  . Order #: [id number] Class: Print  . Order #: [id number] Class: Normal  . Order #: [id number] Class: Print  . Order #: [id number] Class: Normal    Allergies Keflex [cephalexin]  Family History  Problem Relation Age of Onset  . Diabetes Mother   .  Diabetes Father   . Cancer Brother     ?lung  . Cancer Brother   . Colon cancer Neg Hx     Social History Social History  Substance Use Topics  . Smoking status: Current Every Day Smoker    Packs/day: 1.00    Types: Cigarettes  . Smokeless tobacco: Former Systems developer  . Alcohol use No    Review of Systems Constitutional: No fever/chills Eyes:  No visual changes. ENT: No sore throat. Cardiovascular: Denies chest pain. Respiratory: Denies shortness of breath. Gastrointestinal: Positive for abdominal pain, distension, and nausea.  No vomiting.  No diarrhea.  No constipation. Negative for blood in stool. Genitourinary: Negative for dysuria. Musculoskeletal: Negative for back pain. Skin: Negative for rash. Neurological: Negative for headaches, focal weakness or numbness.  10-point ROS otherwise negative.  ____________________________________________   PHYSICAL EXAM:  VITAL SIGNS: ED Triage Vitals [12/21/16 1041]  Enc Vitals Group     BP (!) 149/88     Pulse Rate 88     Resp 18     Temp 97.6 F (36.4 C)     Temp Source Oral     SpO2 100 %     Weight 176 lb (79.8 kg)     Height 5\' 6"  (1.676 m)     Pain Score 7   Constitutional: Alert and oriented. Well appearing and in no acute distress. Eyes: Conjunctivae are normal.  Head: Atraumatic. Nose: No congestion/rhinnorhea. Mouth/Throat: Mucous membranes are moist.  Neck: No stridor.  Cardiovascular: Normal rate, regular rhythm. Good peripheral circulation. Grossly normal heart sounds.   Respiratory: Normal respiratory effort.  No retractions. Lungs CTAB. Gastrointestinal: Soft and nontender. No appreciable distention. No obvious ascites.  Musculoskeletal: No lower extremity tenderness nor edema. No gross deformities of extremities. Neurologic:  Normal speech and language. No gross focal neurologic deficits are appreciated.  Skin:  Skin is warm, dry and intact. No rash noted.   ____________________________________________   LABS (all labs ordered are listed, but only abnormal results are displayed)  Labs Reviewed  COMPREHENSIVE METABOLIC PANEL - Abnormal; Notable for the following:       Result Value   Potassium 3.3 (*)    Glucose, Bld 100 (*)    All other components within normal limits  LIPASE, BLOOD  CBC    ____________________________________________  RADIOLOGY  Dg Abdomen Acute W/chest  Result Date: 12/21/2016 CLINICAL DATA:  Abdominal pain.  Kidney stones. EXAM: DG ABDOMEN ACUTE W/ 1V CHEST COMPARISON:  Abdominal series 10/05/2016 .  Chest x-ray 09/06/2006. FINDINGS: Mediastinum hilar structures normal. Mild left base subsegmental atelectasis. Soft tissue structures are unremarkable. No bowel distention. No free air. Surgical clips over the right abdomen. Pelvic calcifications noted consistent with phleboliths. Degenerative changes lumbar spine and both hips. IMPRESSION: 1.  Mild left base subsegmental atelectasis. 2.  No acute intra-abdominal abnormality. Electronically Signed   By: Marcello Moores  Register   On: 12/21/2016 12:36    ____________________________________________   PROCEDURES  Procedure(s) performed:   Procedures  None ____________________________________________   INITIAL IMPRESSION / ASSESSMENT AND PLAN / ED COURSE  Pertinent labs & imaging results that were available during my care of the patient were reviewed by me and considered in my medical decision making (see chart for details).  Patient presents to the ED with chronic abdominal pain and distension. Normal labs (including LFTs) and plain film of the chest/abdomen. No SBO pattern. No indication for CT imaging with 1+ years of pain and benign exam. Patient has GI appointment schedule  next month. Provided medication for home use.   At this time, I do not feel there is any life-threatening condition present. I have reviewed and discussed all results (EKG, imaging, lab, urine as appropriate), exam findings with patient. I have reviewed nursing notes and appropriate previous records.  I feel the patient is safe to be discharged home without further emergent workup. Discussed usual and customary return precautions. Patient and family (if present) verbalize understanding and are comfortable with this plan.  Patient will  follow-up with their primary care provider. If they do not have a primary care provider, information for follow-up has been provided to them. All questions have been answered.  ____________________________________________  FINAL CLINICAL IMPRESSION(S) / ED DIAGNOSES  Final diagnoses:  Generalized abdominal pain     MEDICATIONS GIVEN DURING THIS VISIT:  None  NEW OUTPATIENT MEDICATIONS STARTED DURING THIS VISIT:  Discharge Medication List as of 12/21/2016 12:58 PM    START taking these medications   Details  dicyclomine (BENTYL) 20 MG tablet Take 1 tablet (20 mg total) by mouth 2 (two) times daily., Starting Thu 12/21/2016, Print    sucralfate (CARAFATE) 1 GM/10ML suspension Take 10 mLs (1 g total) by mouth 4 (four) times daily -  with meals and at bedtime., Starting Thu 12/21/2016, Print         Note:  This document was prepared using Dragon voice recognition software and may include unintentional dictation errors.  Nanda Quinton, MD Emergency Medicine  I personally performed the services described in this documentation, which was scribed in my presence. The recorded information has been reviewed and is accurate.      Margette Fast, MD 12/21/16 [phone]

## 2016-12-21 NOTE — ED Triage Notes (Signed)
Pt reports abd pain and bloating since February.  States she was seen at [facility] and dx with constipation.  No follow up since then.

## 2017-01-05 ENCOUNTER — Ambulatory Visit: Payer: Medicaid Other | Admitting: Physician Assistant

## 2017-01-08 ENCOUNTER — Telehealth: Payer: Self-pay | Admitting: Physician Assistant

## 2017-01-08 ENCOUNTER — Encounter: Payer: Self-pay | Admitting: Physician Assistant

## 2017-01-16 ENCOUNTER — Encounter: Payer: Self-pay | Admitting: Gastroenterology

## 2017-01-16 ENCOUNTER — Ambulatory Visit (INDEPENDENT_AMBULATORY_CARE_PROVIDER_SITE_OTHER): Payer: Medicaid Other | Admitting: Gastroenterology

## 2017-01-16 VITALS — BP 141/88 | HR 106 | Temp 96.6°F | Ht 66.0 in | Wt 166.4 lb

## 2017-01-16 DIAGNOSIS — K2970 Gastritis, unspecified, without bleeding: Secondary | ICD-10-CM | POA: Diagnosis not present

## 2017-01-16 DIAGNOSIS — R197 Diarrhea, unspecified: Secondary | ICD-10-CM

## 2017-01-16 DIAGNOSIS — K2990 Gastroduodenitis, unspecified, without bleeding: Secondary | ICD-10-CM | POA: Diagnosis not present

## 2017-01-16 MED ORDER — DICYCLOMINE HCL 10 MG PO CAPS: 10.0000 mg | ORAL_CAPSULE | Freq: Three times a day (TID) | ORAL | 3 refills | Status: DC

## 2017-01-16 NOTE — Assessment & Plan Note (Signed)
Gastritis noted in the setting of aspirin use. Cautioned against Goody's powders or other aspirin power use. Currently using ibuprofen 800 mg as needed but does not take daily. Advised to limit NSAID use as much as possible. Continue PPI therapy as long she is taking it. Currently patient denies any upper GI complaints. Her turn to the office in 4 months to see Dr. Oneida Alar.

## 2017-01-16 NOTE — Assessment & Plan Note (Signed)
Chronic intermittent diarrhea seems to be worse since she had her gallbladder out but notes significant symptoms when she is nervous or anxious. She did not understand that she should be using TUMS, two before meals for diarrhea. She states the Bentyl really seem to help. She prefers prescription for this. She will take 1 capsule 4 times daily before meals and at bedtime as needed. Return to the office in 4 months to see Dr. Oneida Alar or sooner if needed.

## 2017-01-16 NOTE — Progress Notes (Signed)
Primary Care Physician: Terald Sleeper, PA-C  Primary Gastroenterologist:  Barney Drain, MD   Chief Complaint  Patient presents with  . Diarrhea  . Abdominal Pain    no pain now, went to ER few weeks ago    HPI: Nancy Delacruz is a 56 y.o. female here for follow-up of diarrhea and dysphagia. She also had complaints of abdominal distention. She had EGD and colonoscopy back in January. She had a benign-appearing peptic stricture status post dilation. Mild gastritis but no H. pylori. Small bowel biopsies negative for celiac. 2 small tubular adenomas removed from her colon. Random colon biopsies negative. She had internal hemorrhoids.  Seen in the ED a few weeks ago for worsening right-sided abdominal pain with distention for 2 months. Plain abdominal film unremarkable. CBC, LFTs, lipase all normal. Her weight has fluctuated between 166 pounds in 176 pounds. January 2017 she weighed 135 pounds.  If nerves are messed up then has diarrhea. Bentyl seemed to help. Rare constipation. No melena, brbpr. Little burning with urinating started yesterday. No nocturnal urination and no frequency. Bloating and right sided abdominal pain resolved with bentyl. Feels well from GI standpoint.   Patient states she is trying to limit goody powder use but was given ibuprofen 800 mg by PCP to use in place of. Cautioned about her gastritis, need to continue PPI as long she is taking NSAIDs or aspirin. Limit all NSAIDs and aspirin as much as possible.  Current Outpatient Prescriptions  Medication Sig Dispense Refill  . Aspirin-Acetaminophen-Caffeine (GOODY HEADACHE PO) Take 2 packets by mouth daily as needed (headaches).    . cyclobenzaprine (FLEXERIL) 10 MG tablet Take 10 mg by mouth 3 (three) times daily as needed for muscle spasms.    . hydrOXYzine (VISTARIL) 25 MG capsule Take 1 capsule (25 mg total) by mouth every 8 (eight) hours as needed for anxiety. 90 capsule 1  . omeprazole (PRILOSEC) 20 MG capsule  Take 1 capsule (20 mg total) by mouth daily. 30 capsule 6  . PROAIR HFA 108 (90 Base) MCG/ACT inhaler USE 2 PUFFS 4 TIMES A DAY AS NEEDED  12  . SPIRIVA HANDIHALER 18 MCG inhalation capsule USE 1 PUFF EVERY DAY  12  . topiramate (TOPAMAX) 25 MG tablet TAKE 1 AT BEDTIME X 2 WEEKS, THEN INCREASE TO 2 AT BEDTIME AS TOLERATED. Taking 2 at bedtime  0  . dicyclomine (BENTYL) 10 MG capsule Take 1 capsule (10 mg total) by mouth 4 (four) times daily -  before meals and at bedtime. As needed for diarrhea, abdominal cramping 120 capsule 3  . HYDROcodone-acetaminophen (NORCO/VICODIN) 5-325 MG tablet Take 1 tablet by mouth every 6 (six) hours as needed.  0  . ibuprofen (ADVIL,MOTRIN) 800 MG tablet Take 800 mg by mouth 3 (three) times daily as needed.  1  . LATUDA 40 MG TABS tablet Take 40 mg by mouth daily.  0   No current facility-administered medications for this visit.     Allergies as of 01/16/2017 - Review Complete 01/16/2017  Allergen Reaction Noted  . Keflex [cephalexin] Rash 09/15/2015    ROS:  General: Negative for anorexia, weight loss, fever, chills, fatigue, weakness. ENT: Negative for hoarseness, difficulty swallowing , nasal congestion. CV: Negative for chest pain, angina, palpitations, dyspnea on exertion, peripheral edema.  Respiratory: Negative for dyspnea at rest, dyspnea on exertion, cough, sputum, wheezing.  GI: See history of present illness. GU:  Negative for dysuria, hematuria, urinary incontinence, urinary frequency, nocturnal  urination.  Endo: Negative for unusual weight change.    Physical Examination:   BP (!) 141/88   Pulse (!) 106   Temp (!) 96.6 F (35.9 C) (Oral)   Ht 5\' 6"  (1.676 m)   Wt 166 lb 6.4 oz (75.5 kg)   BMI 26.86 kg/m   General: Well-nourished, well-developed in no acute distress.  Eyes: No icterus. Mouth: Oropharyngeal mucosa moist and pink , no lesions erythema or exudate. Lungs: Clear to auscultation bilaterally.  Heart: Regular rate and  rhythm, no murmurs rubs or gallops.  Abdomen: Bowel sounds are normal, nontender, nondistended, no hepatosplenomegaly or masses, no abdominal bruits or hernia , no rebound or guarding.  Completely benign exam. Extremities: No lower extremity edema. No clubbing or deformities. Neuro: Alert and oriented x 4   Skin: Warm and dry, no jaundice.   Psych: Alert and cooperative, normal mood and affect.  Labs:  Lab Results  Component Value Date   CREATININE 0.85 12/21/2016   BUN 9 12/21/2016   NA 137 12/21/2016   K 3.3 (L) 12/21/2016   CL 104 12/21/2016   CO2 24 12/21/2016   Lab Results  Component Value Date   ALT 18 12/21/2016   AST 23 12/21/2016   ALKPHOS 77 12/21/2016   BILITOT 0.4 12/21/2016   Lab Results  Component Value Date   WBC 7.9 12/21/2016   HGB 13.1 12/21/2016   HCT 38.6 12/21/2016   MCV 88.3 12/21/2016   PLT 333 12/21/2016   Lab Results  Component Value Date   LIPASE 37 12/21/2016    Imaging Studies: Dg Abdomen Acute W/chest  Result Date: 12/21/2016 CLINICAL DATA:  Abdominal pain.  Kidney stones. EXAM: DG ABDOMEN ACUTE W/ 1V CHEST COMPARISON:  Abdominal series 10/05/2016 .  Chest x-ray 09/06/2006. FINDINGS: Mediastinum hilar structures normal. Mild left base subsegmental atelectasis. Soft tissue structures are unremarkable. No bowel distention. No free air. Surgical clips over the right abdomen. Pelvic calcifications noted consistent with phleboliths. Degenerative changes lumbar spine and both hips. IMPRESSION: 1.  Mild left base subsegmental atelectasis. 2.  No acute intra-abdominal abnormality. Electronically Signed   By: Marcello Moores  Register   On: 12/21/2016 12:36

## 2017-01-16 NOTE — Patient Instructions (Signed)
1. Please contact your PCP about urinary symptoms.  2. Please continue Bentyl one capsule up to four times daily for abdominal cramping, diarrhea, bloating. RX sent to your pharmacy.  3. Continue omeprazole once daily before breakfast.  4. Limit use of ibuprofen. AVOID aspirin powders.  5. Return to the office in four months.

## 2017-01-16 NOTE — Progress Notes (Signed)
cc'ed to pcp °

## 2017-02-15 ENCOUNTER — Other Ambulatory Visit (HOSPITAL_COMMUNITY): Payer: Self-pay | Admitting: Neurology

## 2017-02-15 DIAGNOSIS — G8929 Other chronic pain: Secondary | ICD-10-CM

## 2017-02-15 DIAGNOSIS — M545 Low back pain: Principal | ICD-10-CM

## 2017-02-22 ENCOUNTER — Ambulatory Visit (HOSPITAL_COMMUNITY)
Admission: RE | Admit: 2017-02-22 | Discharge: 2017-02-22 | Disposition: A | Discharge status: Home / Self Care | Payer: Medicaid Other | Source: Ambulatory Visit | Attending: Neurology | Admitting: Neurology

## 2017-02-22 DIAGNOSIS — M545 Low back pain: Secondary | ICD-10-CM | POA: Insufficient documentation

## 2017-02-22 DIAGNOSIS — G8929 Other chronic pain: Secondary | ICD-10-CM

## 2017-02-22 DIAGNOSIS — M5136 Other intervertebral disc degeneration, lumbar region: Secondary | ICD-10-CM | POA: Insufficient documentation

## 2017-03-02 ENCOUNTER — Encounter: Payer: Self-pay | Admitting: Family Medicine

## 2017-03-02 ENCOUNTER — Ambulatory Visit (INDEPENDENT_AMBULATORY_CARE_PROVIDER_SITE_OTHER): Payer: Medicaid Other | Admitting: Family Medicine

## 2017-03-02 ENCOUNTER — Other Ambulatory Visit: Payer: Self-pay | Admitting: Family Medicine

## 2017-03-02 VITALS — BP 140/80 | HR 98 | Temp 98.0°F | Resp 18 | Ht 65.5 in | Wt 155.8 lb

## 2017-03-02 DIAGNOSIS — Z1159 Encounter for screening for other viral diseases: Secondary | ICD-10-CM | POA: Diagnosis not present

## 2017-03-02 DIAGNOSIS — Z1239 Encounter for other screening for malignant neoplasm of breast: Secondary | ICD-10-CM

## 2017-03-02 DIAGNOSIS — K2970 Gastritis, unspecified, without bleeding: Secondary | ICD-10-CM | POA: Diagnosis not present

## 2017-03-02 DIAGNOSIS — F411 Generalized anxiety disorder: Secondary | ICD-10-CM

## 2017-03-02 DIAGNOSIS — Z720 Tobacco use: Secondary | ICD-10-CM | POA: Insufficient documentation

## 2017-03-02 DIAGNOSIS — Z86711 Personal history of pulmonary embolism: Secondary | ICD-10-CM | POA: Diagnosis not present

## 2017-03-02 DIAGNOSIS — J439 Emphysema, unspecified: Secondary | ICD-10-CM | POA: Diagnosis not present

## 2017-03-02 DIAGNOSIS — Z1231 Encounter for screening mammogram for malignant neoplasm of breast: Secondary | ICD-10-CM

## 2017-03-02 DIAGNOSIS — Z659 Problem related to unspecified psychosocial circumstances: Secondary | ICD-10-CM | POA: Diagnosis not present

## 2017-03-02 DIAGNOSIS — K2990 Gastroduodenitis, unspecified, without bleeding: Secondary | ICD-10-CM | POA: Diagnosis not present

## 2017-03-02 NOTE — Progress Notes (Signed)
Chief Complaint  Patient presents with  . Establish Care    last PCP visit was sometime this year  . Anxiety  new patient.  Wants to change because according to her  Last provider "didnt do anything".  Specific questioning reveals she is worried about her cholesterol, and immunizations, and health screenings. I explained she should address these concerns with her doctor or see another provider in that office.  She desires to change. Lipids are elevated.  +family history of CAD both patents.  Smoker.  Her Framingham risk of CVD over 10 years is 15.5 %.  She is a candidate for treatment. No PAP in many years No mammogram for 20 years Colon cancer screening recently/ polyps found Has had a flu shot Never had pneumonia shot/prevnar.  It is indicated with COPD/smoking Has a referral for depression and anxiety Complains of chronic pain managed by Dr Alessandra Grout office Smoker - will discuss treatment next time .  Is willing to try quitting but does not express optimism Poor social situation. Is "homeless" living with a cousin.  No  Income, although she considers herself disabled due to "nerves" and "back pain".  Has medicaid and food stamps for assistance.    Patient Active Problem List   Diagnosis Date Noted  . Tobacco abuse 03/02/2017  . History of pulmonary embolism 03/02/2017  . Poor social situation 03/02/2017  . Gastritis and gastroduodenitis 01/16/2017  . Chronic tension-type headache, not intractable 09/08/2016  . Diarrhea 09/04/2016  . Dysphagia 09/04/2016  . Major depressive disorder, recurrent episode, moderate ([place]) 08/01/2016  . Gastroesophageal reflux disease with esophagitis 08/01/2016  . Generalized anxiety disorder 08/01/2016  . COPD with emphysema ([place]) 08/01/2016    Outpatient Encounter Prescriptions as of 03/02/2017  Medication Sig  . cyclobenzaprine (FLEXERIL) 10 MG tablet Take 10 mg by mouth 3 (three) times daily as needed for muscle spasms.  Marland Kitchen dicyclomine  (BENTYL) 10 MG capsule Take 1 capsule (10 mg total) by mouth 4 (four) times daily -  before meals and at bedtime. As needed for diarrhea, abdominal cramping  . HYDROcodone-acetaminophen (NORCO) 7.5-325 MG tablet TAKE 1 TO 2 TABLETS BY MOUTH DAILY AS NEEDED  . ibuprofen (ADVIL,MOTRIN) 800 MG tablet Take 800 mg by mouth 3 (three) times daily as needed.  Marland Kitchen omeprazole (PRILOSEC) 20 MG capsule Take 1 capsule (20 mg total) by mouth daily.  Marland Kitchen PROAIR HFA 108 (90 Base) MCG/ACT inhaler USE 2 PUFFS 4 TIMES A DAY AS NEEDED  . SPIRIVA HANDIHALER 18 MCG inhalation capsule USE 1 PUFF EVERY DAY  . topiramate (TOPAMAX) 25 MG tablet TAKE 1 AT BEDTIME X 2 WEEKS, THEN INCREASE TO 2 AT BEDTIME AS TOLERATED. Taking 2 at bedtime  . Vitamin D, Ergocalciferol, (DRISDOL) 50000 units CAPS capsule Take 50,000 Units by mouth once a week.  . [DISCONTINUED] VOLTAREN 1 % GEL APPLY TO LOWER BACK UP TO 4 TIMES A DAY  . VIIBRYD 10 MG TABS TAKE 1 TABLET DAILY FOR 7 DAYS, THEN INCREASE TO 2 TABLETS DAILY WITH FOOD.   No facility-administered encounter medications on file as of 03/02/2017.     Past Medical History:  Diagnosis Date  . Allergy   . Anemia   . Anxiety   . Arthritis   . Bipolar 1 disorder ([place])   . Clotting disorder ([place])    had a blood clot near liver, one time  . COPD (chronic obstructive pulmonary disease) ([place])   . Depression   . GERD (gastroesophageal reflux disease)   .  History of kidney stones     Past Surgical History:  Procedure Laterality Date  . BIOPSY  09/12/2016   Procedure: BIOPSY;  Surgeon: Danie Binder, MD;  Location: [facility];  Service: Endoscopy;;  random colon bx's, duodenal bx's, gastric bx's  . CHOLECYSTECTOMY  2005  . COLONOSCOPY WITH PROPOFOL N/A 09/12/2016   Dr. Oneida Alar: Random colon biopsies negative, external hemorrhoids, two 3-5 mm polyps in the transverse colon removed were tubular adenomas. Next colonoscopy in 5 years  . ESOPHAGOGASTRODUODENOSCOPY (EGD) WITH PROPOFOL N/A  09/12/2016   Dr. Oneida Alar: Benign-appearing peptic stricture status post dilation, mild erosive gastritis with benign biopsies. Small bowel biopsies negative for celiac.  . HAND SURGERY     left, tendon  . POLYPECTOMY  09/12/2016   Procedure: POLYPECTOMY;  Surgeon: Danie Binder, MD;  Location: [facility];  Service: Endoscopy;;  transverse colon polyps x2  . SAVORY DILATION N/A 09/12/2016   Procedure: SAVORY DILATION;  Surgeon: Danie Binder, MD;  Location: [facility];  Service: Endoscopy;  Laterality: N/A;    Social History   Social History  . Marital status: Divorced    Spouse name: N/A  . Number of children: 0  . Years of education: 12   Occupational History  . unemployed     "back, copd , nerves"   Social History Main Topics  . Smoking status: Current Every Day Smoker    Packs/day: 0.25    Types: Cigarettes    Start date: 2008  . Smokeless tobacco: Never Used  . Alcohol use No  . Drug use: No  . Sexual activity: Not Currently   Other Topics Concern  . Not on file   Social History Narrative   Homeless   Lives with cousin   Is able to drive   Trying to get disability       Family History  Problem Relation Age of Onset  . Diabetes Mother   . Heart disease Mother 25  . Hypertension Mother   . Diabetes Father   . Alcohol abuse Father   . Heart disease Father 1  . Hyperlipidemia Father   . Hypertension Father   . Cancer Brother        ?lung  . Cancer Brother        BONES  . Colon cancer Neg Hx     Review of Systems  Constitutional: Positive for malaise/fatigue. Negative for chills, fever and weight loss.  HENT: Negative for congestion and hearing loss.   Eyes: Negative for blurred vision and pain.  Respiratory: Positive for shortness of breath. Negative for cough.   Cardiovascular: Negative for chest pain and leg swelling.  Gastrointestinal: Negative for abdominal pain, constipation, diarrhea and heartburn.  Genitourinary: Negative for dysuria and  frequency.  Musculoskeletal: Positive for back pain and joint pain. Negative for falls and myalgias.  Neurological: Negative for dizziness, seizures and headaches.  Psychiatric/Behavioral: Positive for depression. Negative for substance abuse. The patient is nervous/anxious. The patient does not have insomnia.     BP 140/80   Pulse 98   Temp 98 F (36.7 C)   Resp 18   Ht 5' 5.5" (1.664 m)   Wt 155 lb 12 oz (70.6 kg)   SpO2 93%   BMI 25.52 kg/m   Physical Exam  Constitutional: She is oriented to person, place, and time. She appears well-developed and well-nourished.  Smells of tobacco  HENT:  Head: Normocephalic and atraumatic.  Mouth/Throat: Oropharynx is clear and  moist.  Eyes: Conjunctivae are normal. Pupils are equal, round, and reactive to light.  Neck: Normal range of motion. Neck supple.  Cardiovascular: Normal rate, regular rhythm and normal heart sounds.   Pulmonary/Chest: Effort normal and breath sounds normal. She has no wheezes.  Abdominal: Soft. Bowel sounds are normal.  Musculoskeletal: Normal range of motion. She exhibits no edema.  No discomfort noted  Lymphadenopathy:    She has no cervical adenopathy.  Neurological: She is alert and oriented to person, place, and time.  Gait normal  Skin: Skin is warm and dry.  Psychiatric:  Blunted.  Poor insight and judjement  Nursing note and vitals reviewed.  ASSESSMENT/PLAN:  1. Tobacco abuse  2. History of pulmonary embolism  3. Gastritis and gastroduodenitis  4. Pulmonary emphysema, unspecified emphysema type ([place])  5. Generalized anxiety disorder - HIV antibody - VITAMIN D 25 Hydroxy (Vit-D Deficiency, Fractures) - Lipid panel - TSH - COMPLETE METABOLIC PANEL WITH GFR  6. Screening breast examination  7. Need for hepatitis C screening test - Hepatitis C antibody  8. Poor social situation    Patient Instructions  Try to quit smoking Walk every day that you are able Need updated blood  work  Come back in a month Need to have a physical and PAP I will order a mammogram     Raylene Everts, MD

## 2017-03-02 NOTE — Patient Instructions (Signed)
Try to quit smoking Walk every day that you are able Need updated blood work  Come back in a month Need to have a physical and PAP I will order a mammogram

## 2017-03-08 ENCOUNTER — Ambulatory Visit (HOSPITAL_COMMUNITY)
Admission: RE | Admit: 2017-03-08 | Discharge: 2017-03-08 | Disposition: A | Discharge status: Home / Self Care | Payer: Medicaid Other | Source: Ambulatory Visit | Attending: Family Medicine | Admitting: Family Medicine

## 2017-03-08 DIAGNOSIS — Z1231 Encounter for screening mammogram for malignant neoplasm of breast: Secondary | ICD-10-CM | POA: Diagnosis present

## 2017-04-09 ENCOUNTER — Encounter: Payer: Self-pay | Admitting: Gastroenterology

## 2017-04-12 ENCOUNTER — Encounter: Payer: Medicaid Other | Admitting: Family Medicine

## 2017-04-13 NOTE — Progress Notes (Signed)
REVIEWED-NO ADDITIONAL RECOMMENDATIONS. 

## 2017-05-04 ENCOUNTER — Encounter: Payer: Medicaid Other | Admitting: Family Medicine

## 2017-05-24 ENCOUNTER — Telehealth: Payer: Self-pay | Admitting: Family Medicine

## 2017-05-24 NOTE — Telephone Encounter (Signed)
Called patient to r/s appt due to cancellation list. lmom.

## 2017-05-31 ENCOUNTER — Encounter: Payer: Self-pay | Admitting: Gastroenterology

## 2017-05-31 ENCOUNTER — Ambulatory Visit (INDEPENDENT_AMBULATORY_CARE_PROVIDER_SITE_OTHER): Payer: Medicaid Other | Admitting: Gastroenterology

## 2017-05-31 DIAGNOSIS — K2100 Gastro-esophageal reflux disease with esophagitis, without bleeding: Secondary | ICD-10-CM

## 2017-05-31 DIAGNOSIS — R1310 Dysphagia, unspecified: Secondary | ICD-10-CM

## 2017-05-31 DIAGNOSIS — K2990 Gastroduodenitis, unspecified, without bleeding: Secondary | ICD-10-CM

## 2017-05-31 DIAGNOSIS — K591 Functional diarrhea: Secondary | ICD-10-CM

## 2017-05-31 DIAGNOSIS — K2970 Gastritis, unspecified, without bleeding: Secondary | ICD-10-CM | POA: Diagnosis not present

## 2017-05-31 DIAGNOSIS — R1319 Other dysphagia: Secondary | ICD-10-CM

## 2017-05-31 NOTE — Progress Notes (Signed)
cc'ed to pcp °

## 2017-05-31 NOTE — Progress Notes (Signed)
ON RECALL  °

## 2017-05-31 NOTE — Patient Instructions (Signed)
FOLLOW A LOW FAT DIET.MEATS SHOULD BE BAKED, BROILED, OR BOILED. AVOID FRIED FOODS. SEE INFO BELOW.  CHEW ONE TUMS WITH MEALS THREE TIMES A DAY TO PREVENT DIARRHEA.  USE BENTYL TO PREVENT DIARRHEA OR ABDOMINAL PAIN. HOLD FOR CONSTIPATION.  FOLLOW UP IN 6 MOS.   Low-Fat Diet BREADS, CEREALS, PASTA, RICE, DRIED PEAS, AND BEANS These products are high in carbohydrates and most are low in fat. Therefore, they can be increased in the diet as substitutes for fatty foods. They too, however, contain calories and should not be eaten in excess. Cereals can be eaten for snacks as well as for breakfast.   FRUITS AND VEGETABLES It is good to eat fruits and vegetables. Besides being sources of fiber, both are rich in vitamins and some minerals. They help you get the daily allowances of these nutrients. Fruits and vegetables can be used for snacks and desserts.  MEATS Limit lean meat, chicken, [place], and fish to no more than 6 ounces per day. Beef, Pork, and Lamb Use lean cuts of beef, pork, and lamb. Lean cuts include:  Extra-lean ground beef.  Arm roast.  Sirloin tip.  Center-cut ham.  Round steak.  Loin chops.  Rump roast.  Tenderloin.  Trim all fat off the outside of meats before cooking. It is not necessary to severely decrease the intake of red meat, but lean choices should be made. Lean meat is rich in protein and contains a highly absorbable form of iron. Premenopausal women, in particular, should avoid reducing lean red meat because this could increase the risk for low red blood cells (iron-deficiency anemia).  Chicken and [place] These are good sources of protein. The fat of poultry can be reduced by removing the skin and underlying fat layers before cooking. Chicken and [place] can be substituted for lean red meat in the diet. Poultry should not be fried or covered with high-fat sauces. Fish and Shellfish Fish is a good source of protein. Shellfish contain cholesterol, but they usually are  low in saturated fatty acids. The preparation of fish is important. Like chicken and [place], they should not be fried or covered with high-fat sauces. EGGS Egg whites contain no fat or cholesterol. They can be eaten often. Try 1 to 2 egg whites instead of whole eggs in recipes or use egg substitutes that do not contain yolk. MILK AND DAIRY PRODUCTS Use skim or 1% milk instead of 2% or whole milk. Decrease whole milk, natural, and processed cheeses. Use nonfat or low-fat (2%) cottage cheese or low-fat cheeses made from vegetable oils. Choose nonfat or low-fat (1 to 2%) yogurt. Experiment with evaporated skim milk in recipes that call for heavy cream. Substitute low-fat yogurt or low-fat cottage cheese for sour cream in dips and salad dressings. Have at least 2 servings of low-fat dairy products, such as 2 glasses of skim (or 1%) milk each day to help get your daily calcium intake. FATS AND OILS Reduce the total intake of fats, especially saturated fat. Butterfat, lard, and beef fats are high in saturated fat and cholesterol. These should be avoided as much as possible. Vegetable fats do not contain cholesterol, but certain vegetable fats, such as coconut oil, palm oil, and palm kernel oil are very high in saturated fats. These should be limited. These fats are often used in bakery goods, processed foods, popcorn, oils, and nondairy creamers. Vegetable shortenings and some peanut butters contain hydrogenated oils, which are also saturated fats. Read the labels on these foods and check  for saturated vegetable oils. Unsaturated vegetable oils and fats do not raise blood cholesterol. However, they should be limited because they are fats and are high in calories. Total fat should still be limited to 30% of your daily caloric intake. Desirable liquid vegetable oils are corn oil, cottonseed oil, olive oil, canola oil, safflower oil, soybean oil, and sunflower oil. Peanut oil is not as good, but small amounts are  acceptable. Buy a heart-healthy tub margarine that has no partially hydrogenated oils in the ingredients. Mayonnaise and salad dressings often are made from unsaturated fats, but they should also be limited because of their high calorie and fat content. Seeds, nuts, peanut butter, olives, and avocados are high in fat, but the fat is mainly the unsaturated type. These foods should be limited mainly to avoid excess calories and fat. OTHER EATING TIPS Snacks  Most sweets should be limited as snacks. They tend to be rich in calories and fats, and their caloric content outweighs their nutritional value. Some good choices in snacks are graham crackers, melba toast, soda crackers, bagels (no egg), English muffins, fruits, and vegetables. These snacks are preferable to snack crackers, [place] fries, TORTILLA CHIPS, and POTATO chips. Popcorn should be air-popped or cooked in small amounts of liquid vegetable oil. Desserts Eat fruit, low-fat yogurt, and fruit ices instead of pastries, cake, and cookies. Sherbet, angel food cake, gelatin dessert, frozen low-fat yogurt, or other frozen products that do not contain saturated fat (pure fruit juice bars, frozen ice pops) are also acceptable.  COOKING METHODS Choose those methods that use little or no fat. They include: Poaching.  Braising.  Steaming.  Grilling.  Baking.  Stir-frying.  Broiling.  Microwaving.  Foods can be cooked in a nonstick pan without added fat, or use a nonfat cooking spray in regular cookware. Limit fried foods and avoid frying in saturated fat. Add moisture to lean meats by using water, broth, cooking wines, and other nonfat or low-fat sauces along with the cooking methods mentioned above. Soups and stews should be chilled after cooking. The fat that forms on top after a few hours in the refrigerator should be skimmed off. When preparing meals, avoid using excess salt. Salt can contribute to raising blood pressure in some people.  EATING  AWAY FROM HOME Order entres, potatoes, and vegetables without sauces or butter. When meat exceeds the size of a deck of cards (3 to 4 ounces), the rest can be taken home for another meal. Choose vegetable or fruit salads and ask for low-calorie salad dressings to be served on the side. Use dressings sparingly. Limit high-fat toppings, such as bacon, crumbled eggs, cheese, sunflower seeds, and olives. Ask for heart-healthy tub margarine instead of butter.

## 2017-05-31 NOTE — Assessment & Plan Note (Signed)
SYMPTOMS CONTROLLED/RESOLVED.  CONTINUE TO MONITOR SYMPTOMS. CONTINUE OMEPRAZOLE.  TAKE 30 MINUTES PRIOR TO YOUR FIRST MEAL.   

## 2017-05-31 NOTE — Progress Notes (Signed)
Subjective:    Patient ID: Nancy Delacruz, female    DOB: 1960/12/26, 56 y.o.   MRN: [id number]  Raylene Everts, MD   HPI FEELS LIKE SHE'S PEEING A LOT. TAKING BENTYL 1-2X/DAY. PAIN: 1-2X/WEEK(CRAMPING). MAY BE HER NERVES. WAS ON VIBRYD FOR EAR INFECTION. NAUSEA OFF AND ON AND MAY BE HER NERVES. DIARRHEA: BETTER. BMs: ALL THE TIME DAILY. MAY BE LOOSE OR HARD. NEVER TRIED TUMS. STILL SMOKING. ALMOST HAD A NERVOUS BREAKDOWN. GOT NEW TEETH. NO PROBLEMS SWALLOWING.   PT DENIES FEVER, CHILLS, HEMATOCHEZIA, vomiting, melena, CHEST PAIN, SHORTNESS OF BREATH, CHANGE IN BOWEL IN HABITS, constipation, problems swallowing, problems with sedation, OR heartburn or indigestion.  Past Medical History:  Diagnosis Date  . Allergy   . Anemia   . Anxiety   . Arthritis   . Bipolar 1 disorder ([place])   . Clotting disorder ([place])    had a blood clot near liver, one time  . COPD (chronic obstructive pulmonary disease) ([place])   . Depression   . GERD (gastroesophageal reflux disease)   . History of kidney stones     Past Surgical History:  Procedure Laterality Date  . BIOPSY  09/12/2016   Procedure: BIOPSY;  Surgeon: Danie Binder, MD;  [facility]: [facility];  Service: Endoscopy;;  random colon bx's, duodenal bx's, gastric bx's  . CHOLECYSTECTOMY  2005  . COLONOSCOPY WITH PROPOFOL N/A 09/12/2016   Dr. Oneida Alar: Random colon biopsies negative, external hemorrhoids, two 3-5 mm polyps in the transverse colon removed were tubular adenomas. Next colonoscopy in 5 years  . ESOPHAGOGASTRODUODENOSCOPY (EGD) WITH PROPOFOL N/A 09/12/2016   Dr. Oneida Alar: Benign-appearing peptic stricture status post dilation, mild erosive gastritis with benign biopsies. Small bowel biopsies negative for celiac.  . HAND SURGERY     left, tendon  . POLYPECTOMY  09/12/2016   Procedure: POLYPECTOMY;  Surgeon: Danie Binder, MD;  [facility]: [facility];  Service: Endoscopy;;  transverse colon polyps x2  . SAVORY DILATION N/A 09/12/2016     Procedure: SAVORY DILATION;  Surgeon: Danie Binder, MD;  Location: [facility];  Service: Endoscopy;  Laterality: N/A;    Allergies  Allergen Reactions  . Keflex [Cephalexin] Rash   Current Outpatient Prescriptions  Medication Sig Dispense Refill  . cyclobenzaprine (FLEXERIL) 10 MG tablet Take 10 mg by mouth 3 (three) times daily as needed for muscle spasms.    Marland Kitchen dicyclomine (BENTYL) 10 MG capsule Take 1 capsule (10 mg total) by mouth 4 (four) times daily -  before meals and at bedtime. As needed for diarrhea, abdominal cramping    . HYDROcodone-acetaminophen (NORCO) 7.5-325 MG tablet TAKE 1 TO 2 TABLETS BY MOUTH DAILY AS NEEDED    . ibuprofen (ADVIL,MOTRIN) 800 MG tablet Take 800 mg by mouth 3 (three) times daily as needed.    Marland Kitchen omeprazole (PRILOSEC) 20 MG capsule Take 1 capsule (20 mg total) by mouth daily.    Marland Kitchen PROAIR HFA 108 (90 Base) MCG/ACT inhaler USE 2 PUFFS 4 TIMES A DAY AS NEEDED    . SPIRIVA HANDIHALER 18 MCG inhalation capsule USE 1 PUFF EVERY DAY    . topiramate (TOPAMAX) 25 MG tablet TAKE 1 AT BEDTIME X 2 WEEKS, THEN INCREASE TO 2 AT BEDTIME AS TOLERATED. Taking 2 at bedtime    . Vitamin D, Ergocalciferol, (DRISDOL) 50000 units CAPS capsule Take 50,000 Units by mouth once a week.    .       Review of Systems PER HPI  OTHERWISE ALL SYSTEMS ARE NEGATIVE.    Objective:   Physical Exam  Constitutional: She is oriented to person, place, and time. She appears well-developed and well-nourished. No distress.  HENT:  Head: Normocephalic and atraumatic.  Mouth/Throat: Oropharynx is clear and moist. No oropharyngeal exudate.  Eyes: Pupils are equal, round, and reactive to light. No scleral icterus.  Neck: Normal range of motion. Neck supple.  Cardiovascular: Normal rate, regular rhythm and normal heart sounds.   Pulmonary/Chest: Effort normal and breath sounds normal. No respiratory distress.  Abdominal: Soft. Bowel sounds are normal. She exhibits no distension. There is no  tenderness.  Musculoskeletal: She exhibits no edema.  Lymphadenopathy:    She has no cervical adenopathy.  Neurological: She is alert and oriented to person, place, and time.  Psychiatric: She has a normal mood and affect.  Vitals reviewed.         Assessment & Plan:

## 2017-05-31 NOTE — Assessment & Plan Note (Signed)
SYMPTOMS CONTROLLED/RESOLVED.  CONTINUE TO MONITOR SYMPTOMS. 

## 2017-05-31 NOTE — Assessment & Plan Note (Signed)
DUE TO IBS  AND BILE SALT INDUCED DIARRHEA. CLINICALLY IMPROVED.   FOLLOW A LOW FAT DIET.MEATS SHOULD BE BAKED, BROILED, OR BOILED. AVOID FRIED FOODS.  HANDOUT GIVEN. EXPLAINED TO PT WHY SHE SHOULD CHEW ONE TUMS WITH MEALS THREE TIMES A DAY TO PREVENT DIARRHEA. USE BENTYL TO PREVENT DIARRHEA OR ABDOMINAL PAIN. HOLD FOR CONSTIPATION. CALL WITH QUESTIONS OR CONCERNS. COMPLETED GAS REIMBURSEMENT PAPERWORK. FOLLOW UP IN 6 MOS.

## 2017-06-01 ENCOUNTER — Encounter: Payer: Medicaid Other | Admitting: Family Medicine

## 2017-06-22 ENCOUNTER — Ambulatory Visit (INDEPENDENT_AMBULATORY_CARE_PROVIDER_SITE_OTHER): Payer: Medicaid Other | Admitting: Family Medicine

## 2017-06-22 ENCOUNTER — Other Ambulatory Visit (HOSPITAL_COMMUNITY)
Admission: RE | Admit: 2017-06-22 | Discharge: 2017-06-22 | Disposition: A | Discharge status: Home / Self Care | Payer: Medicaid Other | Source: Ambulatory Visit | Attending: Family Medicine | Admitting: Family Medicine

## 2017-06-22 ENCOUNTER — Encounter: Payer: Medicaid Other | Admitting: Family Medicine

## 2017-06-22 ENCOUNTER — Encounter: Payer: Self-pay | Admitting: Family Medicine

## 2017-06-22 VITALS — BP 154/80 | HR 88 | Temp 97.3°F | Resp 16 | Ht 66.0 in | Wt 149.0 lb

## 2017-06-22 DIAGNOSIS — Z124 Encounter for screening for malignant neoplasm of cervix: Secondary | ICD-10-CM | POA: Insufficient documentation

## 2017-06-22 DIAGNOSIS — R350 Frequency of micturition: Secondary | ICD-10-CM

## 2017-06-22 DIAGNOSIS — N904 Leukoplakia of vulva: Secondary | ICD-10-CM | POA: Diagnosis not present

## 2017-06-22 DIAGNOSIS — Z720 Tobacco use: Secondary | ICD-10-CM

## 2017-06-22 DIAGNOSIS — N3281 Overactive bladder: Secondary | ICD-10-CM

## 2017-06-22 DIAGNOSIS — Z23 Encounter for immunization: Secondary | ICD-10-CM | POA: Diagnosis not present

## 2017-06-22 DIAGNOSIS — F338 Other recurrent depressive disorders: Secondary | ICD-10-CM | POA: Insufficient documentation

## 2017-06-22 DIAGNOSIS — J439 Emphysema, unspecified: Secondary | ICD-10-CM

## 2017-06-22 DIAGNOSIS — Z0000 Encounter for general adult medical examination without abnormal findings: Secondary | ICD-10-CM | POA: Insufficient documentation

## 2017-06-22 DIAGNOSIS — K219 Gastro-esophageal reflux disease without esophagitis: Secondary | ICD-10-CM | POA: Insufficient documentation

## 2017-06-22 DIAGNOSIS — F411 Generalized anxiety disorder: Secondary | ICD-10-CM | POA: Insufficient documentation

## 2017-06-22 MED ORDER — SULFAMETHOXAZOLE-TRIMETHOPRIM 800-160 MG PO TABS: 1.0000 | ORAL_TABLET | Freq: Two times a day (BID) | ORAL | 0 refills | Status: DC

## 2017-06-22 MED ORDER — ONDANSETRON HCL 4 MG PO TABS: 4.0000 mg | ORAL_TABLET | Freq: Three times a day (TID) | ORAL | 1 refills | Status: DC | PRN

## 2017-06-22 MED ORDER — MIRABEGRON ER 25 MG PO TB24: 25.0000 mg | ORAL_TABLET | Freq: Every day | ORAL | 11 refills | Status: DC

## 2017-06-22 NOTE — Patient Instructions (Addendum)
Please get blood work and urine test today I will send you a letter with your test results.  If there is anything of concern, we will call right away. Try to cut down or quit smoking Call if you want to try nicotine patches Take the myrabegron for urine frequency Use the zofran as needed nausea  Take the septra antibiotic 2 times a day and use the warm compresses to the forehead  See me in 2-3 months I will call Dr Merlene Laughter about the stroke testing

## 2017-06-22 NOTE — Progress Notes (Signed)
Chief Complaint  Patient presents with  . Annual Exam   Here for annual examination does not had a Pap smear in many years.  Mammogram was normal last month.  Is never had a DEXA scan.  Is postmenopausal.  She has no vaginal discharge or symptoms.  She is not currently sexually active.  She does have urinary frequency.  No burning. She agrees to a flu shot and Prevnar today. She continues to smoke cigarettes about half pack a day.  She states she has not been successful in quitting in the past due to "bad nerves".  She is not a candidate for Wellbutrin or for Chantix because of her mental illness.  She is offered nicotine patches She is compliant with her medication.  Her weight is stable.  She has had no falls. She requests a CAT scan of her head.  When I asked her why she states that her psychiatrist thinks she might of had a stroke.  She is under the care of Dr. Merlene Laughter for chronic pain.  I suggested she bring it up with him for evaluation  Patient Active Problem List   Diagnosis Date Noted  . Lichen sclerosus et atrophicus of the vulva 06/22/2017  . Tobacco abuse 03/02/2017  . History of pulmonary embolism 03/02/2017  . Poor social situation 03/02/2017  . Gastritis and gastroduodenitis 01/16/2017  . Chronic tension-type headache, not intractable 09/08/2016  . Diarrhea 09/04/2016  . Dysphagia 09/04/2016  . Major depressive disorder, recurrent episode, moderate ([place]) 08/01/2016  . Gastroesophageal reflux disease with esophagitis 08/01/2016  . Generalized anxiety disorder 08/01/2016  . COPD with emphysema ([place]) 08/01/2016    Outpatient Encounter Prescriptions as of 06/22/2017  Medication Sig  . cyclobenzaprine (FLEXERIL) 10 MG tablet Take 10 mg by mouth 3 (three) times daily as needed for muscle spasms.  Marland Kitchen dicyclomine (BENTYL) 10 MG capsule Take 1 capsule (10 mg total) by mouth 4 (four) times daily -  before meals and at bedtime. As needed for diarrhea, abdominal cramping  .  HYDROcodone-acetaminophen (NORCO) 7.5-325 MG tablet TAKE 1 TO 2 TABLETS BY MOUTH DAILY AS NEEDED  . ibuprofen (ADVIL,MOTRIN) 800 MG tablet Take 800 mg by mouth 3 (three) times daily as needed.  Marland Kitchen omeprazole (PRILOSEC) 20 MG capsule Take 1 capsule (20 mg total) by mouth daily.  Marland Kitchen PROAIR HFA 108 (90 Base) MCG/ACT inhaler USE 2 PUFFS 4 TIMES A DAY AS NEEDED  . SPIRIVA HANDIHALER 18 MCG inhalation capsule USE 1 PUFF EVERY DAY  . topiramate (TOPAMAX) 25 MG tablet TAKE 1 AT BEDTIME X 2 WEEKS, THEN INCREASE TO 2 AT BEDTIME AS TOLERATED. Taking 2 at bedtime  . VIIBRYD 10 MG TABS TAKE 1 TABLET DAILY FOR 7 DAYS, THEN INCREASE TO 2 TABLETS DAILY WITH FOOD.  . Vitamin D, Ergocalciferol, (DRISDOL) 50000 units CAPS capsule Take 50,000 Units by mouth once a week.  . mirabegron ER (MYRBETRIQ) 25 MG TB24 tablet Take 1 tablet (25 mg total) by mouth daily. For bladder frequency  . ondansetron (ZOFRAN) 4 MG tablet Take 1 tablet (4 mg total) by mouth every 8 (eight) hours as needed for nausea or vomiting.  . sulfamethoxazole-trimethoprim (BACTRIM DS,SEPTRA DS) 800-160 MG tablet Take 1 tablet by mouth 2 (two) times daily.   No facility-administered encounter medications on file as of 06/22/2017.     Allergies  Allergen Reactions  . Keflex [Cephalexin] Rash    Review of Systems  Constitutional: Negative for activity change, appetite change and unexpected weight  change.  HENT: Positive for facial swelling. Negative for congestion, dental problem, postnasal drip and rhinorrhea.        Infection above left eyebrow  Eyes: Negative for redness and visual disturbance.  Respiratory: Negative for cough and shortness of breath.   Cardiovascular: Negative for chest pain, palpitations and leg swelling.  Gastrointestinal: Negative for abdominal pain, constipation and diarrhea.  Genitourinary: Positive for frequency and urgency. Negative for difficulty urinating, dysuria, hematuria, menstrual problem, vaginal bleeding and  vaginal discharge.  Musculoskeletal: Positive for back pain. Negative for gait problem.       Chronic low back pain  Neurological: Negative for dizziness and headaches.  Psychiatric/Behavioral: Negative for dysphoric mood and sleep disturbance. The patient is nervous/anxious.        Under care of psychiatry     BP (!) 154/80 (BP Location: Right Arm, Patient Position: Sitting, Cuff Size: Normal)   Pulse 88   Temp (!) 97.3 F (36.3 C) (Temporal)   Resp 16   Ht 5\' 6"  (1.676 m)   Wt 149 lb (67.6 kg)   SpO2 98%   BMI 24.05 kg/m   Physical Exam BP (!) 154/80 (BP Location: Right Arm, Patient Position: Sitting, Cuff Size: Normal)   Pulse 88   Temp (!) 97.3 F (36.3 C) (Temporal)   Resp 16   Ht 5\' 6"  (1.676 m)   Wt 149 lb (67.6 kg)   SpO2 98%   BMI 24.05 kg/m   General Appearance:    Alert, cooperative, no distress, appears stated age.  Slow responses, poor fund of knowledge  Head:    Normocephalic, without obvious abnormality, atraumatic  Eyes:    PERRL, conjunctiva/corneas clear, EOM's intact, fundi    benign, both eyes.  Left eyebrow has small abscess, 2 cm across, with crusted center  Ears:    Normal TM's and external ear canals, both ears  Nose:   Nares normal, septum midline, mucosa normal, no drainage    or sinus tenderness  Throat:   Lips, mucosa, and tongue normal; teeth= many absent and gums normal  Neck:   Supple, symmetrical, trachea midline, no adenopathy;    thyroid:  no enlargement/tenderness/nodules; no carotid   bruit   Back:     Symmetric, no curvature, ROM normal, no CVA tenderness  Lungs:     Clear to auscultation bilaterally, respirations unlabored  Chest Wall:    No tenderness or deformity   Heart:    Regular rate and rhythm, S1 and S2 normal, no murmur, rub   or gallop  Breast Exam:    No tenderness, masses, or nipple abnormality  Abdomen:     Soft, non-tender, bowel sounds active all four quadrants,    no masses, no organomegaly.  Well-healed right  upper quadrant scar  Genitalia:    Normal female without, discharge or tenderness.  White plaque noted at introitus, posterior fourchette with mild inflammation.  Pap and bimanual done  Extremities:   Extremities normal, atraumatic, no cyanosis or edema  Pulses:   2+ and symmetric all extremities  Skin:   Skin color, texture, turgor normal, no rashes or lesions  Lymph nodes:   Cervical, supraclavicular, and axillary nodes normal  Neurologic:   normal strength, sensation and reflexes    throughout    ASSESSMENT/PLAN:  1. Encounter for annual physical exam - CBC - COMPLETE METABOLIC PANEL WITH GFR - Lipid panel - TSH - VITAMIN D 25 Hydroxy (Vit-D Deficiency, Fractures) - HIV antibody (with reflex) - Hepatitis C  Antibody - Cytology - PAP  2. Pulmonary emphysema, unspecified emphysema type ([place])  3. Tobacco abuse Counseled.  Offered nicotine patches  4. Needs flu shot - Flu Vaccine QUAD 36+ mos IM  5. Urine frequency   6. OAB (overactive bladder) Trial of Myrbetriq  7. Lichen sclerosus et atrophicus of the vulva   8. Screening for cervical cancer - Cytology - PAP   Patient Instructions  Please get blood work and urine test today I will send you a letter with your test results.  If there is anything of concern, we will call right away. Try to cut down or quit smoking Call if you want to try nicotine patches Take the myrabegron for urine frequency Use the zofran as needed nausea  Take the septra antibiotic 2 times a day and use the warm compresses to the forehead  See me in 2-3 months I will call Dr Merlene Laughter about the stroke testing     Raylene Everts, MD

## 2017-06-23 LAB — CBC
HCT: 35.2 % (ref 35.0–45.0)
HEMOGLOBIN: 11.9 g/dL (ref 11.7–15.5)
MCH: 30.3 pg (ref 27.0–33.0)
MCHC: 33.8 g/dL (ref 32.0–36.0)
MCV: 89.6 fL (ref 80.0–100.0)
MPV: 10.2 fL (ref 7.5–12.5)
Platelets: 304 10*3/uL (ref 140–400)
RBC: 3.93 10*6/uL (ref 3.80–5.10)
RDW: 12 % (ref 11.0–15.0)
WBC: 6.4 10*3/uL (ref 3.8–10.8)

## 2017-06-23 LAB — URINALYSIS, ROUTINE W REFLEX MICROSCOPIC
BILIRUBIN URINE: NEGATIVE
Bacteria, UA: NONE SEEN /HPF
GLUCOSE, UA: NEGATIVE
HGB URINE DIPSTICK: NEGATIVE
Hyaline Cast: NONE SEEN /LPF
KETONES UR: NEGATIVE
NITRITE: NEGATIVE
PH: 6 (ref 5.0–8.0)
Protein, ur: NEGATIVE
RBC / HPF: NONE SEEN /HPF (ref 0–2)
Specific Gravity, Urine: 1.017 (ref 1.001–1.03)
Squamous Epithelial / LPF: NONE SEEN /HPF (ref <=5)

## 2017-06-23 LAB — COMPLETE METABOLIC PANEL WITH GFR
AG RATIO: 1.7 (calc) (ref 1.0–2.5)
ALBUMIN MSPROF: 4.2 g/dL (ref 3.6–5.1)
ALT: 12 U/L (ref 6–29)
AST: 16 U/L (ref 10–35)
Alkaline phosphatase (APISO): 73 U/L (ref 33–130)
BILIRUBIN TOTAL: 0.4 mg/dL (ref 0.2–1.2)
BUN: 8 mg/dL (ref 7–25)
CHLORIDE: 105 mmol/L (ref 98–110)
CO2: 27 mmol/L (ref 20–32)
Calcium: 8.8 mg/dL (ref 8.6–10.4)
Creat: 0.9 mg/dL (ref 0.50–1.05)
GFR, EST AFRICAN AMERICAN: 82 mL/min/{1.73_m2} (ref >=60)
GFR, Est Non African American: 71 mL/min/{1.73_m2} (ref >=60)
GLOBULIN: 2.5 g/dL (ref 1.9–3.7)
Glucose, Bld: 100 mg/dL (ref 65–139)
POTASSIUM: 3.7 mmol/L (ref 3.5–5.3)
SODIUM: 141 mmol/L (ref 135–146)
TOTAL PROTEIN: 6.7 g/dL (ref 6.1–8.1)

## 2017-06-23 LAB — LIPID PANEL
Cholesterol: 201 mg/dL — ABNORMAL HIGH (ref <200)
HDL: 53 mg/dL (ref >50)
LDL CHOLESTEROL (CALC): 107 mg/dL — AB
NON-HDL CHOLESTEROL (CALC): 148 mg/dL — AB (ref <130)
Total CHOL/HDL Ratio: 3.8 (calc) (ref <5.0)
Triglycerides: 304 mg/dL — ABNORMAL HIGH (ref <150)

## 2017-06-23 LAB — HEPATITIS C ANTIBODY
HEP C AB: NONREACTIVE
SIGNAL TO CUT-OFF: 0.01 (ref <1.00)

## 2017-06-23 LAB — HIV ANTIBODY (ROUTINE TESTING W REFLEX): HIV 1&2 Ab, 4th Generation: NONREACTIVE

## 2017-06-23 LAB — VITAMIN D 25 HYDROXY (VIT D DEFICIENCY, FRACTURES): Vit D, 25-Hydroxy: 42 ng/mL (ref 30–100)

## 2017-06-23 LAB — TSH: TSH: 0.55 m[IU]/L (ref 0.40–4.50)

## 2017-06-26 LAB — CYTOLOGY - PAP: Diagnosis: NEGATIVE

## 2017-06-27 ENCOUNTER — Encounter: Payer: Self-pay | Admitting: Family Medicine

## 2017-09-10 ENCOUNTER — Ambulatory Visit: Payer: Medicaid Other | Admitting: Family Medicine

## 2017-09-17 ENCOUNTER — Other Ambulatory Visit: Payer: Self-pay

## 2017-09-17 ENCOUNTER — Encounter: Payer: Self-pay | Admitting: Family Medicine

## 2017-09-17 ENCOUNTER — Ambulatory Visit (INDEPENDENT_AMBULATORY_CARE_PROVIDER_SITE_OTHER): Payer: Medicaid Other | Admitting: Family Medicine

## 2017-09-17 VITALS — BP 130/80 | HR 83 | Temp 98.2°F | Resp 16 | Ht 66.0 in | Wt 153.2 lb

## 2017-09-17 DIAGNOSIS — G44229 Chronic tension-type headache, not intractable: Secondary | ICD-10-CM | POA: Diagnosis not present

## 2017-09-17 DIAGNOSIS — K2100 Gastro-esophageal reflux disease with esophagitis, without bleeding: Secondary | ICD-10-CM

## 2017-09-17 DIAGNOSIS — J439 Emphysema, unspecified: Secondary | ICD-10-CM | POA: Diagnosis not present

## 2017-09-17 DIAGNOSIS — F411 Generalized anxiety disorder: Secondary | ICD-10-CM

## 2017-09-17 DIAGNOSIS — Z720 Tobacco use: Secondary | ICD-10-CM

## 2017-09-17 NOTE — Progress Notes (Signed)
Chief Complaint  Patient presents with  . Follow-up   Patient is here for routine scheduled follow-up. She is compliant with her psychiatrist.  She states she is happy to be back on Valium.  In spite of this she seems jittery and anxious today.  Her speech is a bit difficult to understand. The only concern she voices is dry skin on her legs, some varicose veins.  These are chronic conditions and we discussed no treatment is indicated, except for her own personal use of lotion. She has not had any difficulty with her COPD, cough, or shortness of breath. She continues to smoke cigarettes.  She has cut down, however, from 1/2 pack a day to a pack every 3-4 days. Her GERD is well controlled with medication. She has not been bothered by headaches. She expresses frustration that she has not yet been accepted for disability.  She lives with her cousin.  She has not happy with her cousins housecleaning, and scrubs everything with Lysol regularly.  Her hands are red and chapped.  I did ask her to get some gloves.  Patient Active Problem List   Diagnosis Date Noted  . Lichen sclerosus et atrophicus of the vulva 06/22/2017  . Tobacco abuse 03/02/2017  . History of pulmonary embolism 03/02/2017  . Poor social situation 03/02/2017  . Gastritis and gastroduodenitis 01/16/2017  . Chronic tension-type headache, not intractable 09/08/2016  . Diarrhea 09/04/2016  . Dysphagia 09/04/2016  . Major depressive disorder, recurrent episode, moderate ([place]) 08/01/2016  . Gastroesophageal reflux disease with esophagitis 08/01/2016  . Generalized anxiety disorder 08/01/2016  . COPD with emphysema ([place]) 08/01/2016    Outpatient Encounter Medications as of 09/17/2017  Medication Sig  . diazepam (VALIUM) 5 MG tablet Take 5 mg by mouth every 6 (six) hours as needed for anxiety.  Marland Kitchen HYDROcodone-acetaminophen (NORCO) 7.5-325 MG tablet TAKE 1 TO 2 TABLETS BY MOUTH DAILY AS NEEDED  . ibuprofen (ADVIL,MOTRIN) 800 MG  tablet Take 800 mg by mouth 3 (three) times daily as needed.  Marland Kitchen PROAIR HFA 108 (90 Base) MCG/ACT inhaler USE 2 PUFFS 4 TIMES A DAY AS NEEDED  . SPIRIVA HANDIHALER 18 MCG inhalation capsule USE 1 PUFF EVERY DAY  . topiramate (TOPAMAX) 25 MG tablet TAKE 1 AT BEDTIME X 2 WEEKS, THEN INCREASE TO 2 AT BEDTIME AS TOLERATED. Taking 2 at bedtime  . dicyclomine (BENTYL) 10 MG capsule Take 1 capsule (10 mg total) by mouth 4 (four) times daily -  before meals and at bedtime. As needed for diarrhea, abdominal cramping (Patient not taking: Reported on 09/17/2017)  . mirabegron ER (MYRBETRIQ) 25 MG TB24 tablet Take 1 tablet (25 mg total) by mouth daily. For bladder frequency (Patient not taking: Reported on 09/17/2017)  . omeprazole (PRILOSEC) 20 MG capsule Take 1 capsule (20 mg total) by mouth daily. (Patient not taking: Reported on 09/17/2017)  . VIIBRYD 10 MG TABS TAKE 1 TABLET DAILY FOR 7 DAYS, THEN INCREASE TO 2 TABLETS DAILY WITH FOOD.   No facility-administered encounter medications on file as of 09/17/2017.     Allergies  Allergen Reactions  . Keflex [Cephalexin] Rash    Review of Systems  Constitutional: Negative.  Negative for activity change, fatigue and fever.  HENT: Negative.   Eyes: Negative.   Respiratory: Negative.  Negative for cough.   Cardiovascular: Negative.  Negative for chest pain.  Gastrointestinal: Negative.  Negative for abdominal pain.  Endocrine: Negative.   Genitourinary: Negative.  Negative for dysuria.  Musculoskeletal:  Positive for arthralgias and back pain.       Chronic back pain  Skin: Negative.        Dry chapped skin  Neurological: Negative.   Psychiatric/Behavioral: Positive for decreased concentration and sleep disturbance. Negative for self-injury and suicidal ideas. The patient is nervous/anxious.        Under care of psychiatry    BP 130/80 (BP Location: Left Arm, Patient Position: Sitting, Cuff Size: Normal)   Pulse 83   Temp 98.2 F (36.8 C) (Temporal)    Resp 16   Ht 5\' 6"  (1.676 m)   Wt 153 lb 4 oz (69.5 kg)   SpO2 98%   BMI 24.74 kg/m   Physical Exam  Constitutional: She is oriented to person, place, and time. She appears well-developed and well-nourished.  Smells of tobacco  HENT:  Head: Normocephalic and atraumatic.  Mouth/Throat: Oropharynx is clear and moist.  Eyes: Conjunctivae are normal. Pupils are equal, round, and reactive to light.  Neck: Normal range of motion. Neck supple.  Cardiovascular: Normal rate, regular rhythm and normal heart sounds.  Pulmonary/Chest: Effort normal and breath sounds normal. She has no wheezes.  Abdominal: Soft. Bowel sounds are normal.  Musculoskeletal: Normal range of motion. She exhibits no edema.  No discomfort noted  Lymphadenopathy:    She has no cervical adenopathy.  Neurological: She is alert and oriented to person, place, and time.  Gait normal  Skin: Skin is warm and dry.  Hands are reddened and checked.  Lower legs have dry skin.  She has a few scattered spider small veins, no large varicose veins or swelling or pain  Psychiatric:  Blunted.  Poor insight and judjement  Nursing note and vitals reviewed.   ASSESSMENT/PLAN:  1. Pulmonary emphysema, unspecified emphysema type ([facility]) Stable  2. Gastroesophageal reflux disease with esophagitis Asymptomatic on medication  3. Chronic tension-type headache, not intractable Not currently active  4. Generalized anxiety disorder Under treatment psychiatry.  Recent addition of Valium.  Seems agitated.  5. Tobacco abuse Continues, although improved   Patient Instructions  Try to cut down or quit smoking Drink more water No change in medicine Stay as active as you can manage Use lotions/use more moisture on the skin    Nancy Everts, MD

## 2017-09-17 NOTE — Patient Instructions (Addendum)
Try to cut down or quit smoking Drink more water No change in medicine Stay as active as you can manage Use lotions/use more moisture on the skin

## 2017-09-19 ENCOUNTER — Other Ambulatory Visit (HOSPITAL_COMMUNITY): Payer: Self-pay | Admitting: Neurology

## 2017-09-19 DIAGNOSIS — S0990XS Unspecified injury of head, sequela: Secondary | ICD-10-CM

## 2017-09-19 DIAGNOSIS — G44309 Post-traumatic headache, unspecified, not intractable: Secondary | ICD-10-CM

## 2017-09-19 DIAGNOSIS — R4781 Slurred speech: Secondary | ICD-10-CM

## 2017-09-24 ENCOUNTER — Ambulatory Visit (HOSPITAL_COMMUNITY)
Admission: RE | Admit: 2017-09-24 | Discharge: 2017-09-24 | Disposition: A | Discharge status: Home / Self Care | Payer: Medicaid Other | Source: Ambulatory Visit | Attending: Neurology | Admitting: Neurology

## 2017-09-24 DIAGNOSIS — R51 Headache: Secondary | ICD-10-CM | POA: Insufficient documentation

## 2017-09-24 DIAGNOSIS — S0990XA Unspecified injury of head, initial encounter: Secondary | ICD-10-CM | POA: Diagnosis not present

## 2017-09-24 DIAGNOSIS — S0990XS Unspecified injury of head, sequela: Secondary | ICD-10-CM

## 2017-09-24 DIAGNOSIS — X58XXXA Exposure to other specified factors, initial encounter: Secondary | ICD-10-CM | POA: Diagnosis not present

## 2017-09-24 DIAGNOSIS — R4781 Slurred speech: Secondary | ICD-10-CM | POA: Diagnosis not present

## 2017-09-24 DIAGNOSIS — G44309 Post-traumatic headache, unspecified, not intractable: Secondary | ICD-10-CM

## 2017-10-24 ENCOUNTER — Encounter: Payer: Self-pay | Admitting: Gastroenterology

## 2017-11-05 ENCOUNTER — Encounter: Payer: Self-pay | Admitting: Family Medicine

## 2017-12-11 ENCOUNTER — Telehealth: Payer: Self-pay | Admitting: Gastroenterology

## 2017-12-11 MED ORDER — DICYCLOMINE HCL 10 MG PO CAPS: 10.0000 mg | ORAL_CAPSULE | Freq: Three times a day (TID) | ORAL | 3 refills | Status: DC

## 2017-12-11 NOTE — Telephone Encounter (Signed)
Done

## 2017-12-11 NOTE — Telephone Encounter (Signed)
Forwarding to refill box.  

## 2017-12-11 NOTE — Telephone Encounter (Signed)
PATIENT WOULD LIKE HER BENTYL SENT TO [facility] FOR A REFILL

## 2017-12-11 NOTE — Addendum Note (Signed)
Addended by: Annitta Needs on: 12/11/2017 04:19 PM   Modules accepted: Orders

## 2017-12-28 ENCOUNTER — Telehealth: Payer: Self-pay | Admitting: Family Medicine

## 2017-12-28 NOTE — Telephone Encounter (Signed)
Dr Meda Coffee term letter mailed on 11/05/17 was returned due to bad address.

## 2018-02-01 IMAGING — DX DG CHEST 2V
2 series · 2 of 2 positions shown · non-contrast
Comparison: 10/22/2015

CLINICAL DATA: Shortness of breath, bronchitis, asthma, smoker

EXAM:
CHEST  2 VIEW

[chest pa]
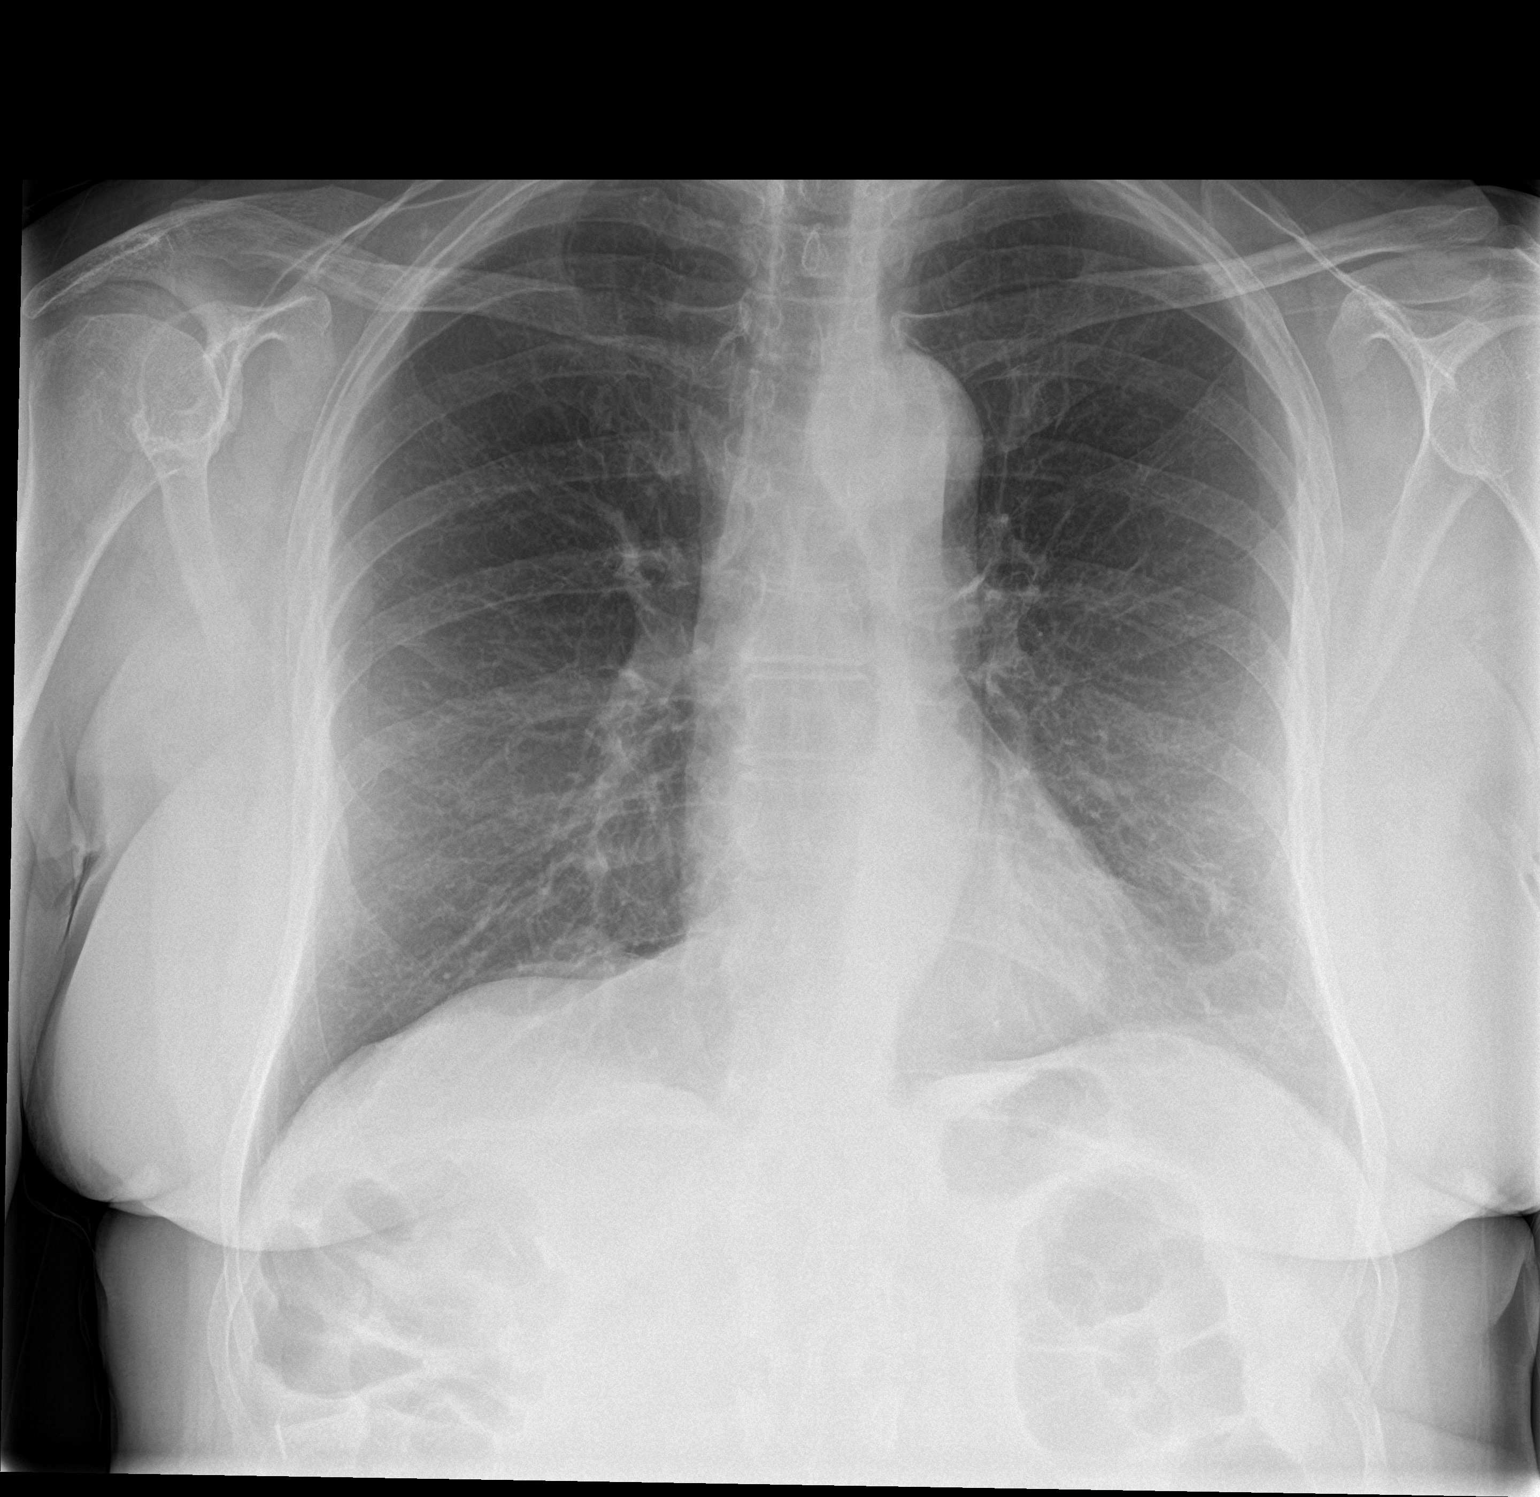

[chest lat]
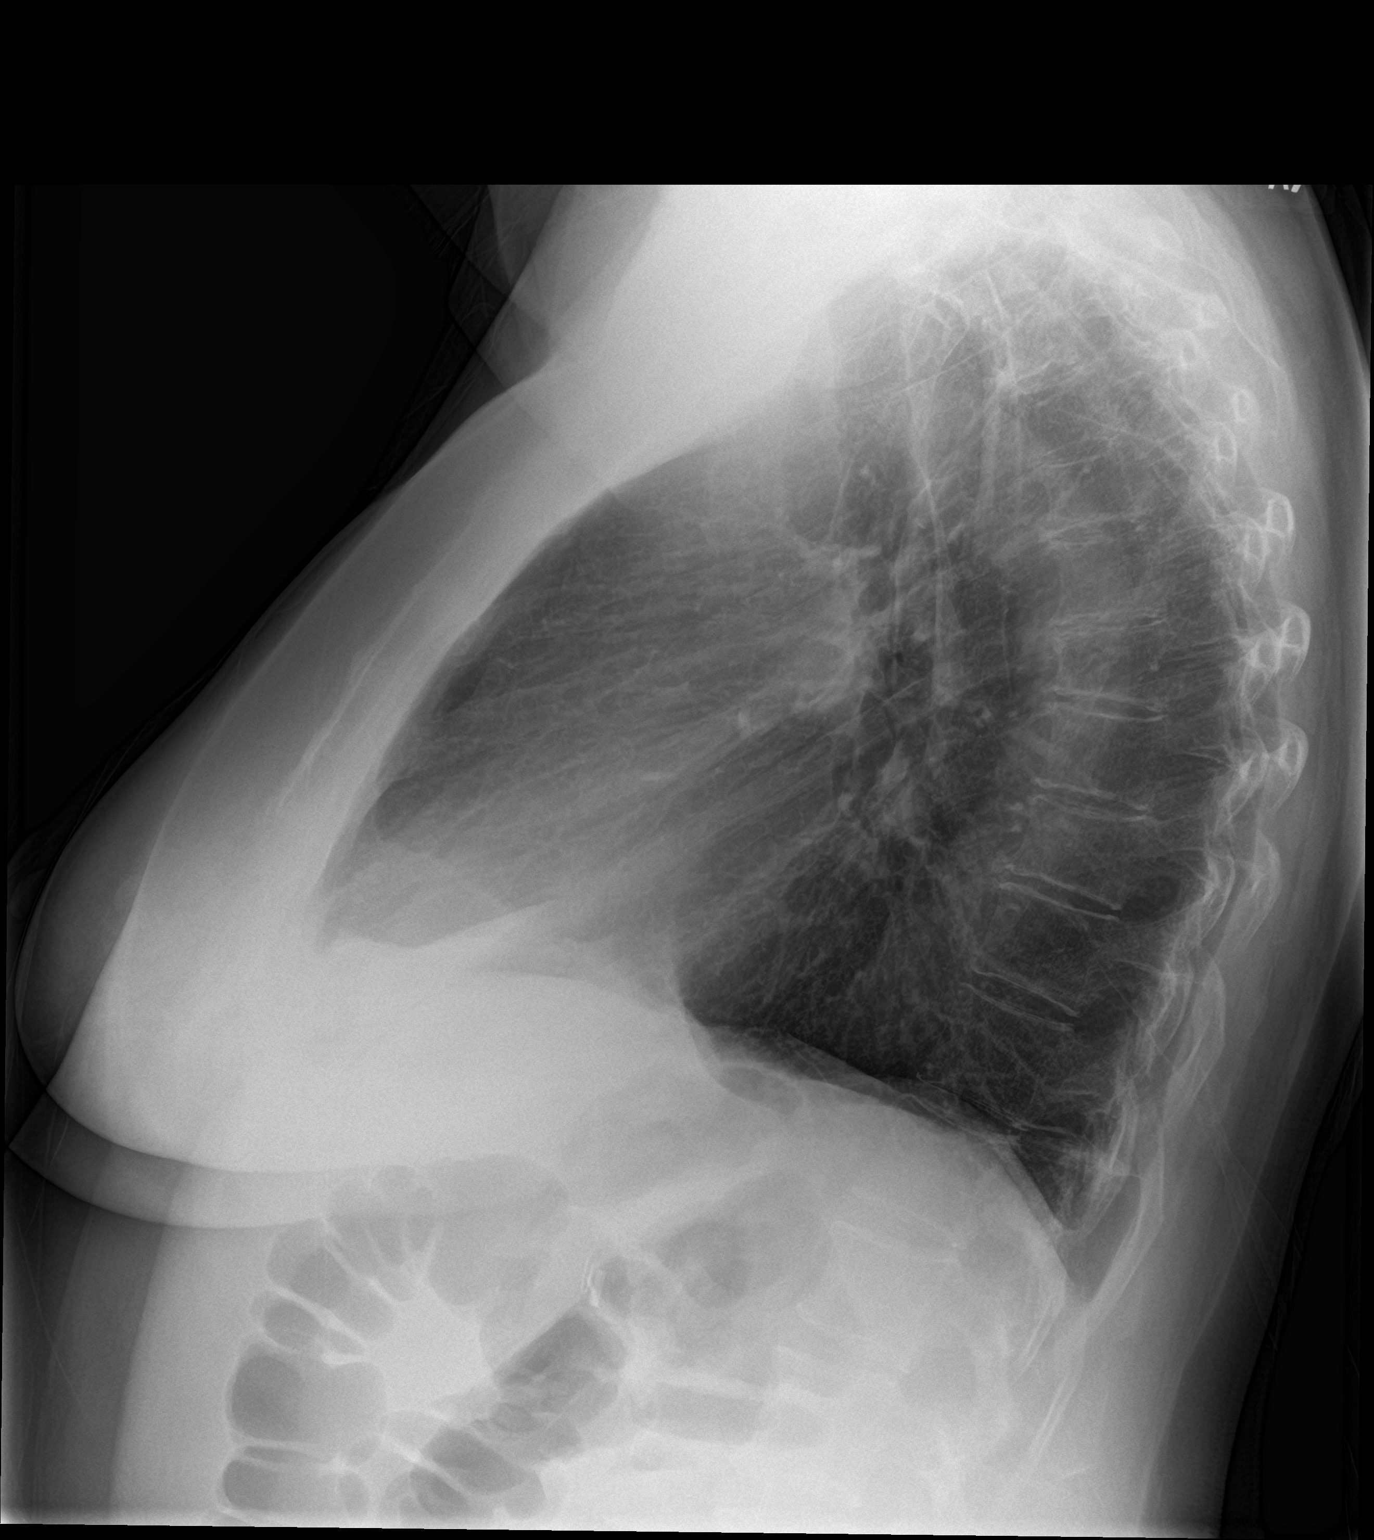

[2 of 2 positions shown; findings below may reference images not displayed]

FINDINGS: Normal heart size, mediastinal contours, and pulmonary vascularity.

Lungs minimally hyperinflated with subsegmental atelectasis at
posterior lung base on lateral view.

Remaining lungs clear.

No pleural effusion or pneumothorax.

Bones demineralized.
IMPRESSION: Subsegmental atelectasis at posterior lung base on lateral view.

## 2018-03-04 ENCOUNTER — Ambulatory Visit: Payer: Medicaid Other | Admitting: Family Medicine

## 2018-03-14 ENCOUNTER — Ambulatory Visit: Payer: Medicaid Other | Admitting: Gastroenterology

## 2018-03-14 ENCOUNTER — Encounter: Payer: Self-pay | Admitting: Gastroenterology

## 2018-07-20 IMAGING — MR MR LUMBAR SPINE W/O CM
4 of 5 series · 15 of 48 positions shown · non-contrast
Comparison: Radiography 09/22/2016

CLINICAL DATA: Low back pain over the last 5 years.

EXAM:
MRI LUMBAR SPINE WITHOUT CONTRAST
TECHNIQUE: Multiplanar, multisequence MR imaging of the lumbar spine was
performed. No intravenous contrast was administered.

[Series 3: T2 · sagittal · 4.0mm · 0.69mm/px · 5 of 13 slices shown (1 of 2)]
[im 1/13]
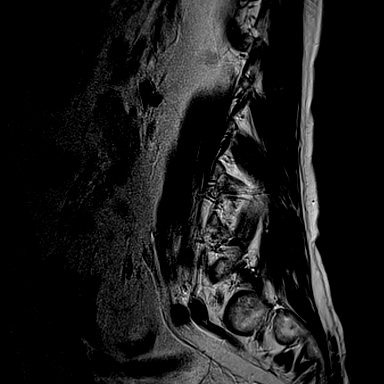
[im 4/13]
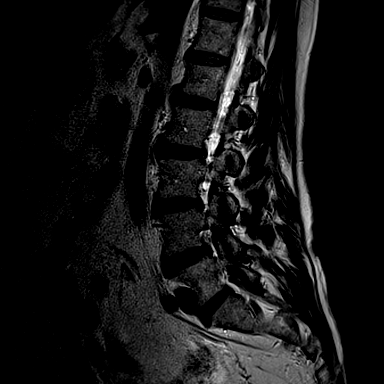
[im 7/13]
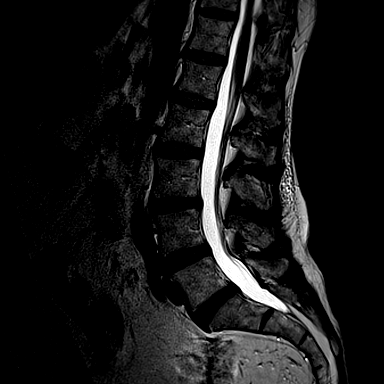
[im 10/13]
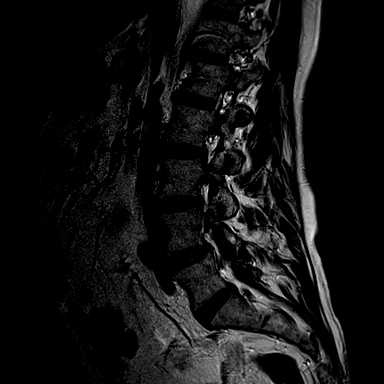
[im 13/13]
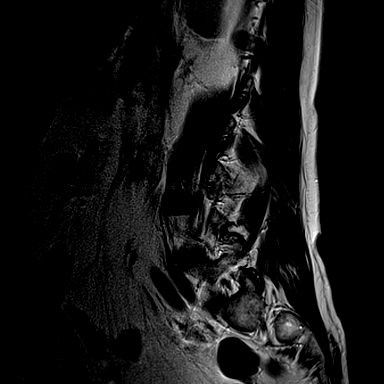

[Series 4: T1 · sagittal · 4.0mm · 0.34mm/px · 3 of 13 slices shown (1 of 2)]
[im 1/13]
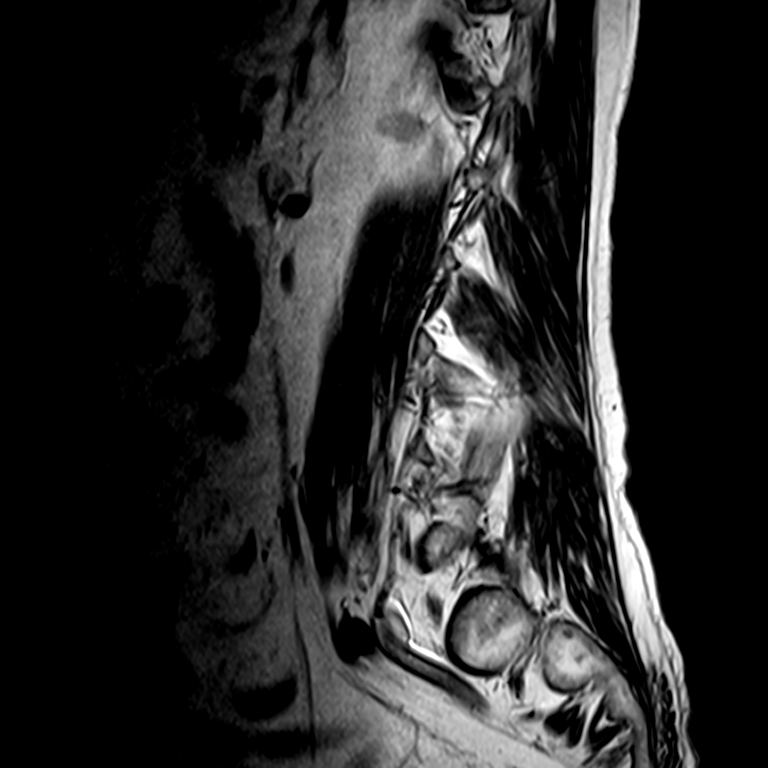
[im 7/13]
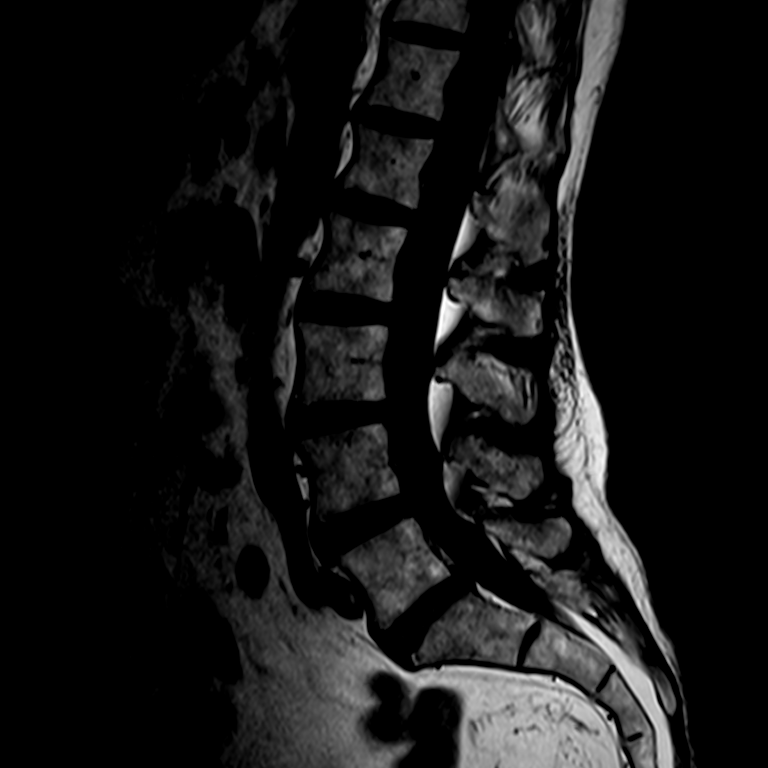
[im 13/13]
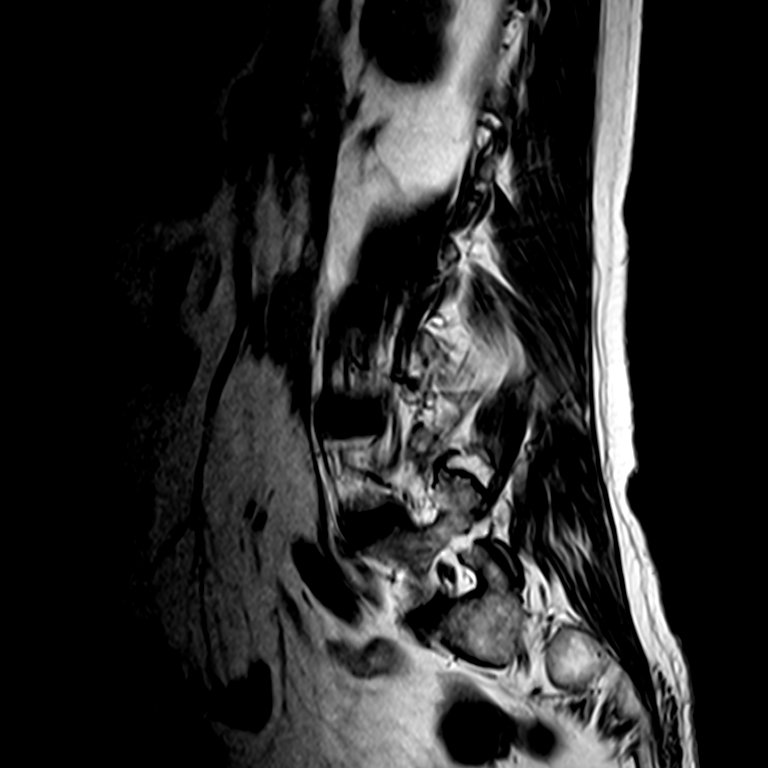

[Series 6: T2 · axial · 4.0mm · 0.25mm/px · z∈[-77,+87]mm · 4 of 36 slices shown (2 of 2)]
[im 3/36]
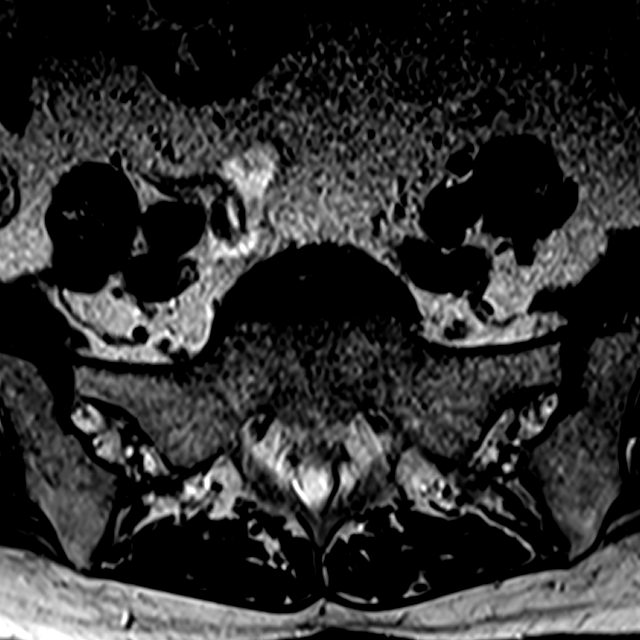
[im 5/36]
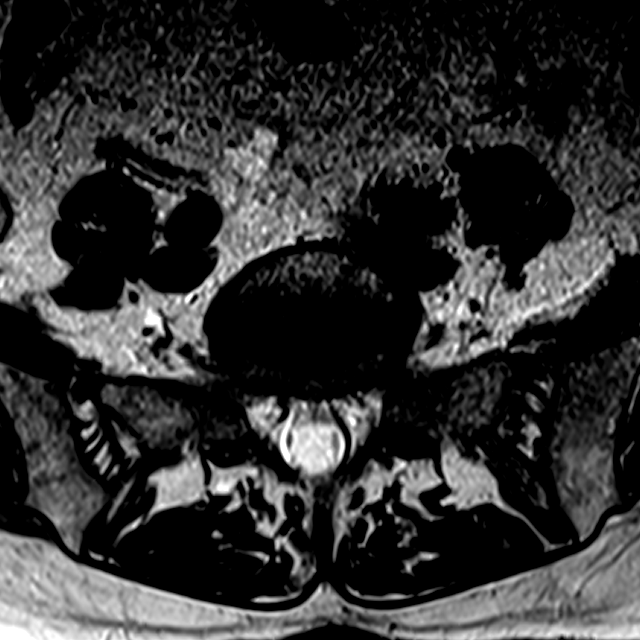
[im 19/36]
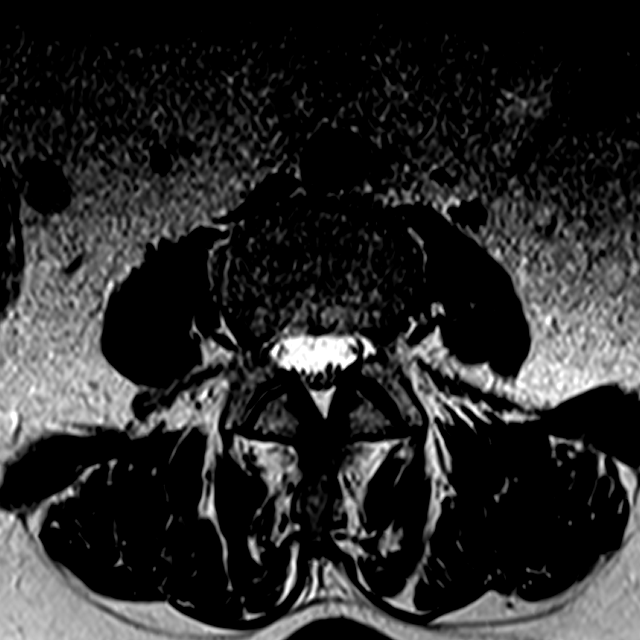
[im 31/36]
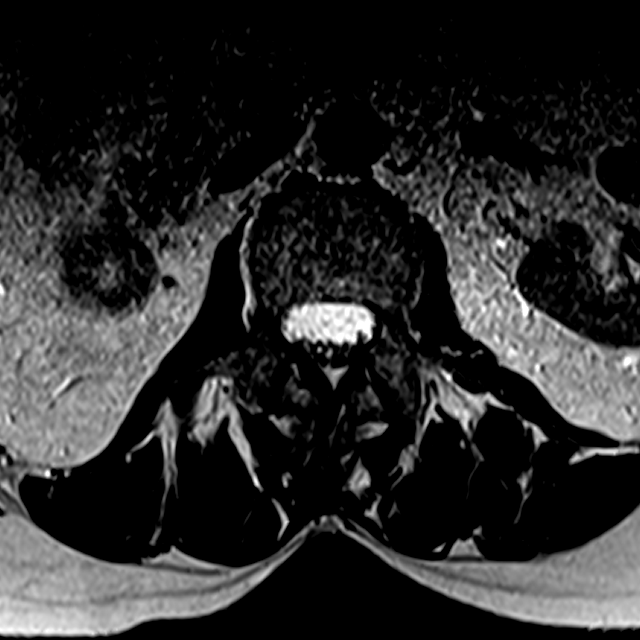

[Series 7: T1 · axial · 4.0mm · 0.25mm/px · z∈[-68,+87]mm · 3 of 36 slices shown (2 of 2)]
[im 5/36]
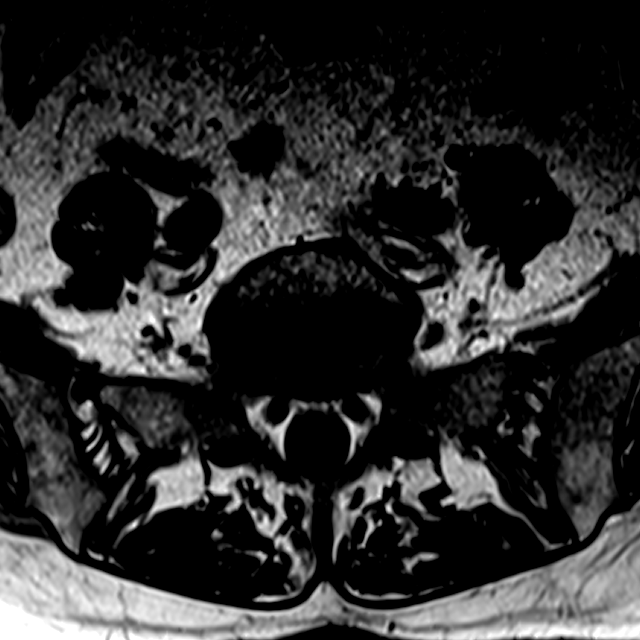
[im 19/36]
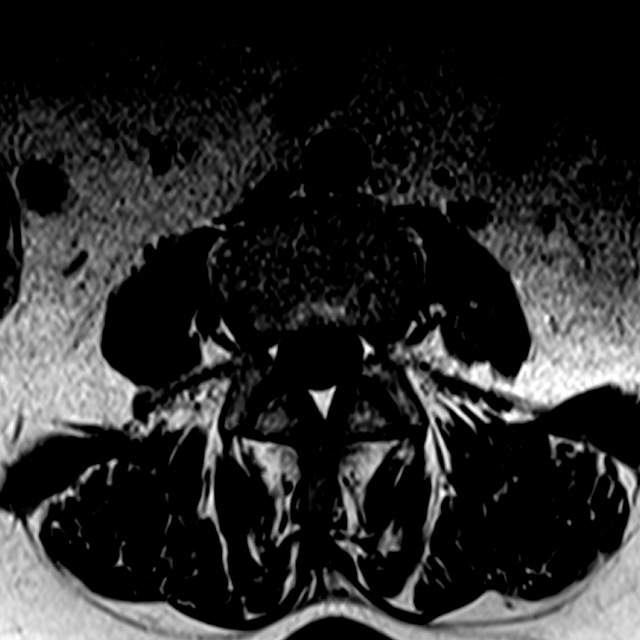
[im 31/36]
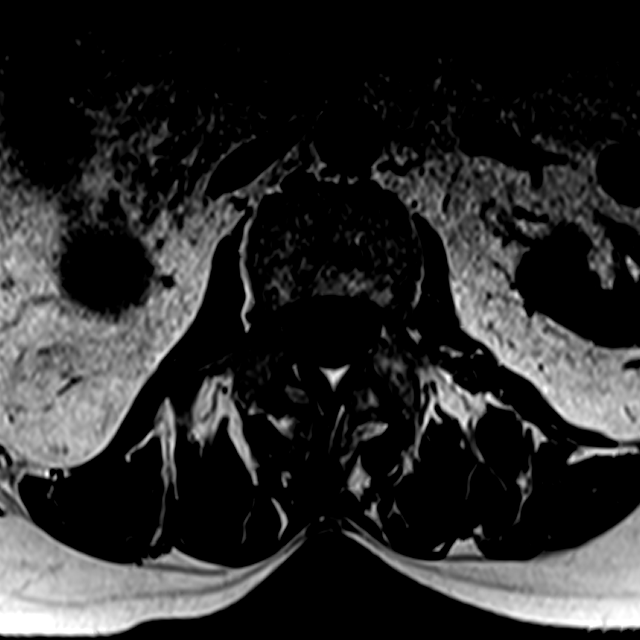

[15 of 48 positions shown; findings below may reference images not displayed]

FINDINGS: Segmentation:  5 lumbar type vertebral bodies.

Alignment:  2 mm anterolisthesis L4-5.

Vertebrae:  No fracture or primary bone lesion.

Conus medullaris: Extends to the L1-2 level and appears normal.

Paraspinal and other soft tissues: Normal

Disc levels:

No significant finding at L2-3 or above.

L3-4:  Mild bulging of the disc.  No stenosis or neural compression.

L4-5: Bilateral facet arthropathy with 2 mm of anterolisthesis.
Minimal bulging of the disc. Mild narrowing of the lateral recesses
without visible neural compression.

L5-S1: Mild bilateral facet osteoarthritis but no slippage. Mild
bulging of the disc. No stenosis or neural compression.
IMPRESSION: Lower lumbar degenerative disc disease and degenerative facet
disease that could result in low back pain. There is no evidence of
advanced disease or spinal stenosis/focal neural compression.

## 2018-11-13 ENCOUNTER — Other Ambulatory Visit (HOSPITAL_COMMUNITY): Payer: Self-pay | Admitting: Physician Assistant

## 2018-11-13 DIAGNOSIS — Z1239 Encounter for other screening for malignant neoplasm of breast: Secondary | ICD-10-CM

## 2018-12-20 ENCOUNTER — Ambulatory Visit (HOSPITAL_COMMUNITY): Payer: Medicaid Other

## 2019-04-23 ENCOUNTER — Other Ambulatory Visit (HOSPITAL_COMMUNITY): Payer: Self-pay | Admitting: Neurology

## 2019-04-23 ENCOUNTER — Other Ambulatory Visit: Payer: Self-pay

## 2019-04-23 ENCOUNTER — Ambulatory Visit (HOSPITAL_COMMUNITY)
Admission: RE | Admit: 2019-04-23 | Discharge: 2019-04-23 | Disposition: A | Discharge status: Home / Self Care | Payer: Medicaid Other | Source: Ambulatory Visit | Attending: Neurology | Admitting: Neurology

## 2019-04-23 DIAGNOSIS — M25531 Pain in right wrist: Secondary | ICD-10-CM | POA: Insufficient documentation

## 2019-11-29 NOTE — Progress Notes (Deleted)
Referring Provider: No ref. provider found Primary Care Physician:  Patient, No Pcp Per Primary GI Physician: Dr. Oneida Alar  No chief complaint on file.   HPI:   Nancy Delacruz is a 59 y.o. female presenting today for ***. 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  EGD 09/12/2016 with dysphagia due to benign appearing peptic stricture s/p dilation, small hiatal hernia, mild erosive gastritis, no obvious source for diarrhea.  Biopsies taken of the duodenum were benign.  Gastric biopsies with reactive gastropathy/chronic inflammation, negative H. Pylori.  Last colonoscopy 09/12/2016 with 2 tubular adenomas, redundant left colon, external hemorrhoids.  Random colon biopsies benign. Recommended repeat in 5-10 years.  Today:      Past Medical History:  Diagnosis Date  . Allergy   . Anemia   . Anxiety   . Arthritis   . Bipolar 1 disorder ([place])   . Clotting disorder ([place])    had a blood clot near liver, one time  . COPD (chronic obstructive pulmonary disease) ([place])   . Depression   . GERD (gastroesophageal reflux disease)   . History of kidney stones     Past Surgical History:  Procedure Laterality Date  . BIOPSY  09/12/2016   Procedure: BIOPSY;  Surgeon: Danie Binder, MD;  [facility];  Service: Endoscopy;;  random colon bx's, duodenal bx's, gastric bx's  . CHOLECYSTECTOMY  2005  . COLONOSCOPY WITH PROPOFOL N/A 09/12/2016   Dr. Oneida Alar: Random colon biopsies negative, external hemorrhoids, two 3-5 mm polyps in the transverse colon removed were tubular adenomas. Next colonoscopy in 5 years  . ESOPHAGOGASTRODUODENOSCOPY (EGD) WITH PROPOFOL N/A 09/12/2016   Dr. Oneida Alar: Benign-appearing peptic stricture status post dilation, mild erosive gastritis with benign biopsies. Small bowel biopsies negative for celiac.  . HAND SURGERY     left, tendon  . POLYPECTOMY  09/12/2016   Procedure: POLYPECTOMY;  Surgeon: Danie Binder, MD;  [facility];  Service: Endoscopy;;  transverse colon polyps x2  . SAVORY DILATION N/A 09/12/2016   Procedure: SAVORY DILATION;  Surgeon: Danie Binder, MD;  [facility]: [facility];  Service: Endoscopy;  Laterality: N/A;    Current Outpatient Medications  Medication Sig Dispense Refill  . diazepam (VALIUM) 5 MG tablet Take 5 mg by mouth every 6 (six) hours as needed for anxiety.    . dicyclomine (BENTYL) 10 MG capsule Take 1 capsule (10 mg total) by mouth 4 (four) times daily -  before meals and at bedtime. As needed for diarrhea, abdominal cramping 120 capsule 3  . HYDROcodone-acetaminophen (NORCO) 7.5-325 MG tablet TAKE 1 TO 2 TABLETS BY MOUTH DAILY AS NEEDED  0  . ibuprofen (ADVIL,MOTRIN) 800 MG tablet Take 800 mg by mouth 3 (three) times daily as needed.  1  . mirabegron ER (MYRBETRIQ) 25 MG TB24 tablet Take 1 tablet (25 mg total) by mouth daily. For bladder frequency (Patient not taking: Reported on 09/17/2017) 30 tablet 11  . omeprazole (PRILOSEC) 20 MG capsule Take 1 capsule (20 mg total) by mouth daily. (Patient not taking: Reported on 09/17/2017) 30 capsule 6  . PROAIR HFA 108 (90 Base) MCG/ACT inhaler USE 2 PUFFS 4 TIMES A DAY AS NEEDED  12  . SPIRIVA HANDIHALER 18 MCG inhalation capsule USE 1 PUFF EVERY DAY  12  . topiramate (TOPAMAX) 25 MG tablet TAKE 1 AT BEDTIME X 2 WEEKS, THEN INCREASE TO 2 AT BEDTIME AS TOLERATED.  Taking 2 at bedtime  0  . VIIBRYD 10 MG TABS TAKE 1 TABLET DAILY FOR 7 DAYS, THEN INCREASE TO 2 TABLETS DAILY WITH FOOD.  0   No current facility-administered medications for this visit.    Allergies as of 12/01/2019 - Review Complete 09/17/2017  Allergen Reaction Noted  . Keflex [cephalexin] Rash 09/15/2015    Family History  Problem Relation Age of Onset  . Diabetes Mother   . Heart disease Mother 14  . Hypertension Mother   . Diabetes Father   . Alcohol abuse Father   . Heart disease Father 56  . Hyperlipidemia Father   .  Hypertension Father   . Cancer Brother        ?lung  . Cancer Brother        BONES  . Colon cancer Neg Hx     Social History   Socioeconomic History  . Marital status: Divorced    Spouse name: Not on file  . Number of children: 0  . Years of education: 38  . Highest education level: Not on file  Occupational History  . Occupation: unemployed    Comment: "back, copd , nerves"  Tobacco Use  . Smoking status: Current Every Day Smoker    Packs/day: 0.25    Types: Cigarettes    Start date: 2008  . Smokeless tobacco: Never Used  . Tobacco comment: 1/2 pack a day  Substance and Sexual Activity  . Alcohol use: No  . Drug use: No  . Sexual activity: Not Currently  Other Topics Concern  . Not on file  Social History Narrative   Homeless   Lives with cousin   Is able to drive   Trying to get disability   Social Determinants of Health   Financial Resource Strain:   . Difficulty of Paying Living Expenses:   Food Insecurity:   . Worried About Charity fundraiser in the Last Year:   . Arboriculturist in the Last Year:   Transportation Needs:   . Film/video editor (Medical):   Marland Kitchen Lack of Transportation (Non-Medical):   Physical Activity:   . Days of Exercise per Week:   . Minutes of Exercise per Session:   Stress:   . Feeling of Stress :   Social Connections:   . Frequency of Communication with Friends and Family:   . Frequency of Social Gatherings with Friends and Family:   . Attends Religious Services:   . Active Member of Clubs or Organizations:   . Attends Archivist Meetings:   Marland Kitchen Marital Status:     Review of Systems: Gen: Denies fever, chills, anorexia. Denies fatigue, weakness, weight loss.  CV: Denies chest pain, palpitations, syncope, peripheral edema, and claudication. Resp: Denies dyspnea at rest, cough, wheezing, coughing up blood, and pleurisy. GI: Denies vomiting blood, jaundice, and fecal incontinence.   Denies dysphagia or  odynophagia. Derm: Denies rash, itching, dry skin Psych: Denies depression, anxiety, memory loss, confusion. No homicidal or suicidal ideation.  Heme: Denies bruising, bleeding, and enlarged lymph nodes.  Physical Exam: There were no vitals taken for this visit. General:   Alert and oriented. No distress noted. Pleasant and cooperative.  Head:  Normocephalic and atraumatic. Eyes:  Conjuctiva clear without scleral icterus. Mouth:  Oral mucosa pink and moist. Good dentition. No lesions. Heart:  S1, S2 present without murmurs appreciated. Lungs:  Clear to auscultation bilaterally. No wheezes, rales, or rhonchi. No distress.  Abdomen:  +BS, soft,  non-tender and non-distended. No rebound or guarding. No HSM or masses noted. Msk:  Symmetrical without gross deformities. Normal posture. Extremities:  Without edema. Neurologic:  Alert and  oriented x4 Psych:  Alert and cooperative. Normal mood and affect.

## 2019-12-01 ENCOUNTER — Encounter: Payer: Self-pay | Admitting: Gastroenterology

## 2019-12-01 ENCOUNTER — Ambulatory Visit: Payer: Medicaid Other | Admitting: Gastroenterology

## 2019-12-01 ENCOUNTER — Telehealth: Payer: Self-pay | Admitting: Gastroenterology

## 2019-12-01 NOTE — Telephone Encounter (Signed)
Noted  

## 2019-12-01 NOTE — Telephone Encounter (Signed)
PATIENT WAS A NO SHOW AND LETTER SENT  °

## 2019-12-16 ENCOUNTER — Other Ambulatory Visit: Payer: Self-pay | Admitting: Neurology

## 2019-12-16 ENCOUNTER — Other Ambulatory Visit (HOSPITAL_COMMUNITY): Payer: Self-pay | Admitting: Neurology

## 2019-12-16 DIAGNOSIS — M545 Low back pain, unspecified: Secondary | ICD-10-CM

## 2019-12-27 NOTE — Progress Notes (Deleted)
Referring Provider: No ref. provider found Primary Care Physician:  Patient, No Pcp Per Primary GI Physician: Dr. Oneida Alar  No chief complaint on file.   HPI:   Nancy Delacruz is a 59 y.o. female presenting today with a history of dysphagia s/p dilation in 2018, gastritis without H. pylori, GERD, and diarrhea suspected to be secondary to IBS and bile sale diarrhea that has been controlled with bentyl. Duodenal biopsies negative for celiac and segmental colon biopsies negative in 2018. Also noted 2 tubular adenomas on last TCS in 2018 with recommendations to repeat in 5-10 years.   Last seen in our office 05/31/2017 for follow up and was doing fairly well. Dysphagia resolved. GERD well controlled on omeprazole 20 mg daily. BMs all the time daily; may be loose or hard.ADvised to continue current medications, add 1 tums before meals TID,  And follow low fat diet.   Today:  Past Medical History:  Diagnosis Date  . Allergy   . Anemia   . Anxiety   . Arthritis   . Bipolar 1 disorder ([place])   . Clotting disorder ([place])    had a blood clot near liver, one time  . COPD (chronic obstructive pulmonary disease) ([place])   . Depression   . GERD (gastroesophageal reflux disease)   . History of kidney stones     Past Surgical History:  Procedure Laterality Date  . BIOPSY  09/12/2016   Procedure: BIOPSY;  Surgeon: Danie Binder, MD;  [facility];  Service: Endoscopy;;  random colon bx's, duodenal bx's, gastric bx's  . CHOLECYSTECTOMY  2005  . COLONOSCOPY WITH PROPOFOL N/A 09/12/2016   Dr. Oneida Alar: Random colon biopsies negative, external hemorrhoids, two 3-5 mm polyps in the transverse colon removed were tubular adenomas. Next colonoscopy in 5 years  . ESOPHAGOGASTRODUODENOSCOPY (EGD) WITH PROPOFOL N/A 09/12/2016   Dr. Oneida Alar: Benign-appearing peptic stricture status post dilation, mild erosive gastritis with benign biopsies. Small bowel biopsies negative for celiac.  . HAND SURGERY     left, tendon  . POLYPECTOMY  09/12/2016   Procedure: POLYPECTOMY;  Surgeon: Danie Binder, MD;  [facility];  Service: Endoscopy;;  transverse colon polyps x2  . SAVORY DILATION N/A 09/12/2016   Procedure: SAVORY DILATION;  Surgeon: Danie Binder, MD;  [facility];  Service: Endoscopy;  Laterality: N/A;    Current Outpatient Medications  Medication Sig Dispense Refill  . diazepam (VALIUM) 5 MG tablet Take 5 mg by mouth every 6 (six) hours as needed for anxiety.    . dicyclomine (BENTYL) 10 MG capsule Take 1 capsule (10 mg total) by mouth 4 (four) times daily -  before meals and at bedtime. As needed for diarrhea, abdominal cramping 120 capsule 3  . HYDROcodone-acetaminophen (NORCO) 7.5-325 MG tablet TAKE 1 TO 2 TABLETS BY MOUTH DAILY AS NEEDED  0  . ibuprofen (ADVIL,MOTRIN) 800 MG tablet Take 800 mg by mouth 3 (three) times daily as needed.  1  . mirabegron ER (MYRBETRIQ) 25 MG TB24 tablet Take 1 tablet (25 mg total) by mouth daily. For bladder frequency (Patient not taking: Reported on 09/17/2017) 30 tablet 11  . omeprazole (PRILOSEC) 20 MG capsule Take 1 capsule (20 mg total) by mouth daily. (Patient not taking: Reported on 09/17/2017) 30 capsule 6  . PROAIR HFA 108 (90 Base) MCG/ACT inhaler USE 2 PUFFS 4 TIMES A DAY AS NEEDED  12  . SPIRIVA HANDIHALER 18 MCG inhalation capsule USE 1 PUFF  EVERY DAY  12  . topiramate (TOPAMAX) 25 MG tablet TAKE 1 AT BEDTIME X 2 WEEKS, THEN INCREASE TO 2 AT BEDTIME AS TOLERATED. Taking 2 at bedtime  0  . VIIBRYD 10 MG TABS TAKE 1 TABLET DAILY FOR 7 DAYS, THEN INCREASE TO 2 TABLETS DAILY WITH FOOD.  0   No current facility-administered medications for this visit.    Allergies as of 12/29/2019 - Review Complete 09/17/2017  Allergen Reaction Noted  . Keflex [cephalexin] Rash 09/15/2015    Family History  Problem Relation Age of Onset  . Diabetes Mother   . Heart disease Mother 47  . Hypertension Mother   . Diabetes Father   .  Alcohol abuse Father   . Heart disease Father 5  . Hyperlipidemia Father   . Hypertension Father   . Cancer Brother        ?lung  . Cancer Brother        BONES  . Colon cancer Neg Hx     Social History   Socioeconomic History  . Marital status: Divorced    Spouse name: Not on file  . Number of children: 0  . Years of education: 89  . Highest education level: Not on file  Occupational History  . Occupation: unemployed    Comment: "back, copd , nerves"  Tobacco Use  . Smoking status: Current Every Day Smoker    Packs/day: 0.25    Types: Cigarettes    Start date: 2008  . Smokeless tobacco: Never Used  . Tobacco comment: 1/2 pack a day  Substance and Sexual Activity  . Alcohol use: No  . Drug use: No  . Sexual activity: Not Currently  Other Topics Concern  . Not on file  Social History Narrative   Homeless   Lives with cousin   Is able to drive   Trying to get disability   Social Determinants of Health   Financial Resource Strain:   . Difficulty of Paying Living Expenses:   Food Insecurity:   . Worried About Charity fundraiser in the Last Year:   . Arboriculturist in the Last Year:   Transportation Needs:   . Film/video editor (Medical):   Marland Kitchen Lack of Transportation (Non-Medical):   Physical Activity:   . Days of Exercise per Week:   . Minutes of Exercise per Session:   Stress:   . Feeling of Stress :   Social Connections:   . Frequency of Communication with Friends and Family:   . Frequency of Social Gatherings with Friends and Family:   . Attends Religious Services:   . Active Member of Clubs or Organizations:   . Attends Archivist Meetings:   Marland Kitchen Marital Status:     Review of Systems: Gen: Denies fever, chills, anorexia. Denies fatigue, weakness, weight loss.  CV: Denies chest pain, palpitations, syncope, peripheral edema, and claudication. Resp: Denies dyspnea at rest, cough, wheezing, coughing up blood, and pleurisy. GI: Denies  vomiting blood, jaundice, and fecal incontinence.   Denies dysphagia or odynophagia. Derm: Denies rash, itching, dry skin Psych: Denies depression, anxiety, memory loss, confusion. No homicidal or suicidal ideation.  Heme: Denies bruising, bleeding, and enlarged lymph nodes.  Physical Exam: There were no vitals taken for this visit. General:   Alert and oriented. No distress noted. Pleasant and cooperative.  Head:  Normocephalic and atraumatic. Eyes:  Conjuctiva clear without scleral icterus. Mouth:  Oral mucosa pink and moist. Good dentition. No lesions.  Heart:  S1, S2 present without murmurs appreciated. Lungs:  Clear to auscultation bilaterally. No wheezes, rales, or rhonchi. No distress.  Abdomen:  +BS, soft, non-tender and non-distended. No rebound or guarding. No HSM or masses noted. Msk:  Symmetrical without gross deformities. Normal posture. Extremities:  Without edema. Neurologic:  Alert and  oriented x4 Psych:  Alert and cooperative. Normal mood and affect.

## 2019-12-29 ENCOUNTER — Ambulatory Visit: Payer: Medicaid Other | Admitting: Gastroenterology

## 2020-01-13 ENCOUNTER — Encounter (HOSPITAL_COMMUNITY): Payer: Self-pay

## 2020-01-13 ENCOUNTER — Ambulatory Visit (HOSPITAL_COMMUNITY): Payer: Medicaid Other

## 2020-02-02 ENCOUNTER — Ambulatory Visit (HOSPITAL_COMMUNITY): Payer: Medicaid Other

## 2020-02-04 ENCOUNTER — Telehealth: Payer: Self-pay | Admitting: Neurology

## 2020-02-16 ENCOUNTER — Other Ambulatory Visit (HOSPITAL_COMMUNITY): Payer: Medicaid Other

## 2020-02-16 ENCOUNTER — Encounter (HOSPITAL_COMMUNITY): Payer: Self-pay

## 2020-02-20 ENCOUNTER — Ambulatory Visit (HOSPITAL_COMMUNITY): Payer: Medicaid Other

## 2020-08-11 ENCOUNTER — Ambulatory Visit: Payer: Self-pay | Admitting: Nurse Practitioner

## 2020-09-03 ENCOUNTER — Ambulatory Visit: Payer: Medicaid Other | Admitting: Nurse Practitioner

## 2020-09-14 ENCOUNTER — Ambulatory Visit: Payer: Medicaid Other | Admitting: Nurse Practitioner

## 2020-09-17 ENCOUNTER — Encounter: Payer: Self-pay | Admitting: Nurse Practitioner

## 2020-09-17 ENCOUNTER — Ambulatory Visit (INDEPENDENT_AMBULATORY_CARE_PROVIDER_SITE_OTHER): Payer: Medicaid Other | Admitting: Nurse Practitioner

## 2020-09-17 ENCOUNTER — Other Ambulatory Visit: Payer: Self-pay

## 2020-09-17 VITALS — BP 128/73 | HR 95 | Temp 97.7°F | Ht 66.0 in | Wt 158.2 lb

## 2020-09-17 DIAGNOSIS — Z23 Encounter for immunization: Secondary | ICD-10-CM

## 2020-09-17 DIAGNOSIS — Z0000 Encounter for general adult medical examination without abnormal findings: Secondary | ICD-10-CM | POA: Insufficient documentation

## 2020-09-17 DIAGNOSIS — K2100 Gastro-esophageal reflux disease with esophagitis, without bleeding: Secondary | ICD-10-CM

## 2020-09-17 DIAGNOSIS — Z7689 Persons encountering health services in other specified circumstances: Secondary | ICD-10-CM

## 2020-09-17 DIAGNOSIS — F411 Generalized anxiety disorder: Secondary | ICD-10-CM

## 2020-09-17 MED ORDER — OMEPRAZOLE 20 MG PO CPDR: 20.0000 mg | DELAYED_RELEASE_CAPSULE | Freq: Every day | ORAL | 3 refills | Status: DC

## 2020-09-17 NOTE — Patient Instructions (Addendum)
Follow up in 2 weeks for complete physical and labs. Medication refill sent to pharmacy   Generalized Anxiety Disorder, Adult Generalized anxiety disorder (GAD) is a mental health condition. Unlike normal worries, anxiety related to GAD is not triggered by a specific event. These worries do not fade or get better with time. GAD interferes with relationships, work, and school. GAD symptoms can vary from mild to severe. People with severe GAD can have intense waves of anxiety with physical symptoms that are similar to panic attacks. What are the causes? The exact cause of GAD is not known, but the following are believed to have an impact:  Differences in natural brain chemicals.  Genes passed down from parents to children.  Differences in the way threats are perceived.  Development during childhood.  Personality. What increases the risk? The following factors may make you more likely to develop this condition:  Being female.  Having a family history of anxiety disorders.  Being very shy.  Experiencing very stressful life events, such as the death of a loved one.  Having a very stressful family environment. What are the signs or symptoms? People with GAD often worry excessively about many things in their lives, such as their health and family. Symptoms may also include:  Mental and emotional symptoms: ? Worrying excessively about natural disasters. ? Fear of being late. ? Difficulty concentrating. ? Fears that others are judging your performance.  Physical symptoms: ? Fatigue. ? Headaches, muscle tension, muscle twitches, trembling, or feeling shaky. ? Feeling like your heart is pounding or beating very fast. ? Feeling out of breath or like you cannot take a deep breath. ? Having trouble falling asleep or staying asleep, or experiencing restlessness. ? Sweating. ? Nausea, diarrhea, or irritable bowel syndrome (IBS).  Behavioral symptoms: ? Experiencing erratic moods or  irritability. ? Avoidance of new situations. ? Avoidance of people. ? Extreme difficulty making decisions. How is this diagnosed? This condition is diagnosed based on your symptoms and medical history. You will also have a physical exam. Your health care provider may perform tests to rule out other possible causes of your symptoms. To be diagnosed with GAD, a person must have anxiety that:  Is out of his or her control.  Affects several different aspects of his or her life, such as work and relationships.  Causes distress that makes him or her unable to take part in normal activities.  Includes at least three symptoms of GAD, such as restlessness, fatigue, trouble concentrating, irritability, muscle tension, or sleep problems. Before your health care provider can confirm a diagnosis of GAD, these symptoms must be present more days than they are not, and they must last for 6 months or longer. How is this treated? This condition may be treated with:  Medicine. Antidepressant medicine is usually prescribed for long-term daily control. Anti-anxiety medicines may be added in severe cases, especially when panic attacks occur.  Talk therapy (psychotherapy). Certain types of talk therapy can be helpful in treating GAD by providing support, education, and guidance. Options include: ? Cognitive behavioral therapy (CBT). People learn coping skills and self-calming techniques to ease their physical symptoms. They learn to identify unrealistic thoughts and behaviors and to replace them with more appropriate thoughts and behaviors. ? Acceptance and commitment therapy (ACT). This treatment teaches people how to be mindful as a way to cope with unwanted thoughts and feelings. ? Biofeedback. This process trains you to manage your body's response (physiological response) through breathing techniques and relaxation  methods. You will work with a therapist while machines are used to monitor your physical  symptoms.  Stress management techniques. These include yoga, meditation, and exercise. A mental health specialist can help determine which treatment is best for you. Some people see improvement with one type of therapy. However, other people require a combination of therapies.   Follow these instructions at home: Lifestyle  Maintain a consistent routine and schedule.  Anticipate stressful situations. Create a plan, and allow extra time to work with your plan.  Practice stress management or self-calming techniques that you have learned from your therapist or your health care provider. General instructions  Take over-the-counter and prescription medicines only as told by your health care provider.  Understand that you are likely to have setbacks. Accept this and be kind to yourself as you persist to take better care of yourself.  Recognize and accept your accomplishments, even if you judge them as small.  Keep all follow-up visits as told by your health care provider. This is important. Contact a health care provider if:  Your symptoms do not get better.  Your symptoms get worse.  You have signs of depression, such as: ? A persistently sad or irritable mood. ? Loss of enjoyment in activities that used to bring you joy. ? Change in weight or eating. ? Changes in sleeping habits. ? Avoiding friends or family members. ? Loss of energy for normal tasks. ? Feelings of guilt or worthlessness. Get help right away if:  You have serious thoughts about hurting yourself or others. If you ever feel like you may hurt yourself or others, or have thoughts about taking your own life, get help right away. Go to your nearest emergency department or:  Call your local emergency services (911 in the [place]  Call a suicide crisis helpline, such as the [facility] at 631 441 5646. This is open 24 hours a day in the [place]  Text the Crisis Text Line at (772)379-2578 (in the  [place].). Summary  Generalized anxiety disorder (GAD) is a mental health condition that involves worry that is not triggered by a specific event.  People with GAD often worry excessively about many things in their lives, such as their health and family.  GAD may cause symptoms such as restlessness, trouble concentrating, sleep problems, frequent sweating, nausea, diarrhea, headaches, and trembling or muscle twitching.  A mental health specialist can help determine which treatment is best for you. Some people see improvement with one type of therapy. However, other people require a combination of therapies. This information is not intended to replace advice given to you by your health care provider. Make sure you discuss any questions you have with your health care provider. Document Revised: 06/04/2019 Document Reviewed: 06/04/2019 Elsevier Patient Education  [place]. [employer]'s Current Therapy 2021 (pp. 213-216). [place], [place]: Elsevier.">  Gastroesophageal Reflux Disease, Adult Gastroesophageal reflux (GER) happens when acid from the stomach flows up into the tube that connects the mouth and the stomach (esophagus). Normally, food travels down the esophagus and stays in the stomach to be digested. However, when a person has GER, food and stomach acid sometimes move back up into the esophagus. If this becomes a more serious problem, the person may be diagnosed with a disease called gastroesophageal reflux disease (GERD). GERD occurs when the reflux:  Happens often.  Causes frequent or severe symptoms.  Causes problems such as damage to the esophagus. When stomach acid comes in contact with the esophagus, the acid  may cause inflammation in the esophagus. Over time, GERD may create small holes (ulcers) in the lining of the esophagus. What are the causes? This condition is caused by a problem with the muscle between the esophagus and the stomach (lower esophageal sphincter, or LES).  Normally, the LES muscle closes after food passes through the esophagus to the stomach. When the LES is weakened or abnormal, it does not close properly, and that allows food and stomach acid to go back up into the esophagus. The LES can be weakened by certain dietary substances, medicines, and medical conditions, including:  Tobacco use.  Pregnancy.  Having a hiatal hernia.  Alcohol use.  Certain foods and beverages, such as coffee, chocolate, onions, and peppermint. What increases the risk? You are more likely to develop this condition if you:  Have an increased body weight.  Have a connective tissue disorder.  Take NSAIDs, such as ibuprofen. What are the signs or symptoms? Symptoms of this condition include:  Heartburn.  Difficult or painful swallowing and the feeling of having a lump in the throat.  A bitter taste in the mouth.  Bad breath and having a large amount of saliva.  Having an upset or bloated stomach and belching.  Chest pain. Different conditions can cause chest pain. Make sure you see your health care provider if you experience chest pain.  Shortness of breath or wheezing.  Ongoing (chronic) cough or a nighttime cough.  Wearing away of tooth enamel.  Weight loss. How is this diagnosed? This condition may be diagnosed based on a medical history and a physical exam. To determine if you have mild or severe GERD, your health care provider may also monitor how you respond to treatment. You may also have tests, including:  A test to examine your stomach and esophagus with a small camera (endoscopy).  A test that measures the acidity level in your esophagus.  A test that measures how much pressure is on your esophagus.  A barium swallow or modified barium swallow test to show the shape, size, and functioning of your esophagus. How is this treated? Treatment for this condition may vary depending on how severe your symptoms are. Your health care provider  may recommend:  Changes to your diet.  Medicine.  Surgery. The goal of treatment is to help relieve your symptoms and to prevent complications. Follow these instructions at home: Eating and drinking  Follow a diet as recommended by your health care provider. This may involve avoiding foods and drinks such as: ? Coffee and tea, with or without caffeine. ? Drinks that contain alcohol. ? Energy drinks and sports drinks. ? Carbonated drinks or sodas. ? Chocolate and cocoa. ? Peppermint and mint flavorings. ? Garlic and onions. ? Horseradish. ? Spicy and acidic foods, including peppers, chili powder, curry powder, vinegar, hot sauces, and barbecue sauce. ? Citrus fruit juices and citrus fruits, such as oranges, lemons, and limes. ? Tomato-based foods, such as red sauce, chili, salsa, and pizza with red sauce. ? Fried and fatty foods, such as donuts, french fries, potato chips, and high-fat dressings. ? High-fat meats, such as hot dogs and fatty cuts of red and white meats, such as rib eye steak, sausage, ham, and bacon. ? High-fat dairy items, such as whole milk, butter, and cream cheese.  Eat small, frequent meals instead of large meals.  Avoid drinking large amounts of liquid with your meals.  Avoid eating meals during the 2-3 hours before bedtime.  Avoid lying down right  after you eat.  Do not exercise right after you eat.   Lifestyle  Do not use any products that contain nicotine or tobacco. These products include cigarettes, chewing tobacco, and vaping devices, such as e-cigarettes. If you need help quitting, ask your health care provider.  Try to reduce your stress by using methods such as yoga or meditation. If you need help reducing stress, ask your health care provider.  If you are overweight, reduce your weight to an amount that is healthy for you. Ask your health care provider for guidance about a safe weight loss goal.   General instructions  Pay attention to any  changes in your symptoms.  Take over-the-counter and prescription medicines only as told by your health care provider. Do not take aspirin, ibuprofen, or other NSAIDs unless your health care provider told you to take these medicines.  Wear loose-fitting clothing. Do not wear anything tight around your waist that causes pressure on your abdomen.  Raise (elevate) the head of your bed about 6 inches (15 cm). You can use a wedge to do this.  Avoid bending over if this makes your symptoms worse.  Keep all follow-up visits. This is important. Contact a health care provider if:  You have: ? New symptoms. ? Unexplained weight loss. ? Difficulty swallowing or it hurts to swallow. ? Wheezing or a persistent cough. ? A hoarse voice.  Your symptoms do not improve with treatment. Get help right away if:  You have sudden pain in your arms, neck, jaw, teeth, or back.  You suddenly feel sweaty, dizzy, or light-headed.  You have chest pain or shortness of breath.  You vomit and the vomit is green, yellow, or black, or it looks like blood or coffee grounds.  You faint.  You have stool that is red, bloody, or black.  You cannot swallow, drink, or eat. These symptoms may represent a serious problem that is an emergency. Do not wait to see if the symptoms will go away. Get medical help right away. Call your local emergency services (911 in the [place] Do not drive yourself to the hospital. Summary  Gastroesophageal reflux happens when acid from the stomach flows up into the esophagus. GERD is a disease in which the reflux happens often, causes frequent or severe symptoms, or causes problems such as damage to the esophagus.  Treatment for this condition may vary depending on how severe your symptoms are. Your health care provider may recommend diet and lifestyle changes, medicine, or surgery.  Contact a health care provider if you have new or worsening symptoms.  Take over-the-counter and  prescription medicines only as told by your health care provider. Do not take aspirin, ibuprofen, or other NSAIDs unless your health care provider told you to do so.  Keep all follow-up visits as told by your health care provider. This is important. This information is not intended to replace advice given to you by your health care provider. Make sure you discuss any questions you have with your health care provider. Document Revised: 02/23/2020 Document Reviewed: 02/23/2020 Elsevier Patient Education  [place].

## 2020-09-17 NOTE — Assessment & Plan Note (Signed)
Well-controlled on Prilosec 20 mg tablets by mouth daily.  No changes necessary.  Refill sent to pharmacy.

## 2020-09-17 NOTE — Assessment & Plan Note (Signed)
Patient is a 60 year old female who is establishing care today.  Completed assessment with patient and discussed health maintenance and preventative care.  Patient will follow-up in 2 weeks for complete physical and lab work.

## 2020-09-17 NOTE — Assessment & Plan Note (Signed)
Anxiety well controlled on BuSpar 10 mg tablet daily.  No changes necessary to medication dose.  Completed GAD-7.

## 2020-09-17 NOTE — Progress Notes (Addendum)
New Patient Note  RE: Nancy Delacruz MRN: [id number] DOB: May 19, 1961 Date of Office Visit: 09/17/2020  Chief Complaint: Establish Care  History of Present Illness:  GERD, Follow up:  The patient was last seen for GERD 4 years ago. Changes made since that visit include none.  She reports excellent compliance with treatment. She is not having side effects.   She is NOT experiencing abdominal bloating, belching, bilious reflux, chest pain, choking on food or cough  ----------------------------------------------------------------------------------------- Anxiety: Patient complains of anxiety disorder.  She has the following symptoms: difficulty concentrating, irritable. Onset of symptoms was approximately 4 years ago, stable since that time. She denies current suicidal and homicidal ideation. Family history significant for no psychiatric illness.Possible organic causes contributing are: none. Risk factors: none Previous treatment includes BuSpar  She complains of the following side effects from the treatment: none.    Assessment and Plan: [name] is a 60 y.o. female with: Gastroesophageal reflux disease with esophagitis Well-controlled on Prilosec 20 mg tablets by mouth daily.  No changes necessary.  Refill sent to pharmacy.  Generalized anxiety disorder Anxiety well controlled on BuSpar 10 mg tablet daily.  No changes necessary to medication dose.  Completed GAD-7.     Return in about 2 weeks (around 10/01/2020).   Diagnostics:   Past Medical History: Patient Active Problem List   Diagnosis Date Noted  . Lichen sclerosus et atrophicus of the vulva 06/22/2017  . Tobacco abuse 03/02/2017  . History of pulmonary embolism 03/02/2017  . Poor social situation 03/02/2017  . Gastritis and gastroduodenitis 01/16/2017  . Chronic tension-type headache, not intractable 09/08/2016  . Diarrhea 09/04/2016  . Dysphagia 09/04/2016  . Major depressive disorder, recurrent episode,  moderate ([place]) 08/01/2016  . Gastroesophageal reflux disease with esophagitis 08/01/2016  . Generalized anxiety disorder 08/01/2016  . COPD with emphysema ([place]) 08/01/2016   Past Medical History:  Diagnosis Date  . Allergy   . Anemia   . Anxiety   . Arthritis   . Bipolar 1 disorder ([place])   . Clotting disorder ([place])    had a blood clot near liver, one time  . COPD (chronic obstructive pulmonary disease) ([place])   . Depression   . GERD (gastroesophageal reflux disease)   . History of kidney stones    Past Surgical History: Past Surgical History:  Procedure Laterality Date  . BIOPSY  09/12/2016   Procedure: BIOPSY;  Surgeon: Danie Binder, MD;  Location: [facility];  Service: Endoscopy;;  random colon bx's, duodenal bx's, gastric bx's  . CHOLECYSTECTOMY  2005  . COLONOSCOPY WITH PROPOFOL N/A 09/12/2016   Dr. Oneida Alar: Random colon biopsies negative, external hemorrhoids, two 3-5 mm polyps in the transverse colon removed were tubular adenomas. Next colonoscopy in 5 years  . ESOPHAGOGASTRODUODENOSCOPY (EGD) WITH PROPOFOL N/A 09/12/2016   Dr. Oneida Alar: Benign-appearing peptic stricture status post dilation, mild erosive gastritis with benign biopsies. Small bowel biopsies negative for celiac.  . HAND SURGERY     left, tendon  . POLYPECTOMY  09/12/2016   Procedure: POLYPECTOMY;  Surgeon: Danie Binder, MD;  Location: [facility];  Service: Endoscopy;;  transverse colon polyps x2  . SAVORY DILATION N/A 09/12/2016   Procedure: SAVORY DILATION;  Surgeon: Danie Binder, MD;  Location: [facility];  Service: Endoscopy;  Laterality: N/A;   Medication List:  Current Outpatient Medications  Medication Sig Dispense Refill  . busPIRone (BUSPAR) 10 MG tablet Take 10 mg by mouth 3 (three) times daily.    Marland Kitchen  HYDROcodone-acetaminophen (NORCO) 10-325 MG tablet 1 tablet every 6 (six) hours as needed.  0  . lisdexamfetamine (VYVANSE) 70 MG capsule Take 70 mg by mouth daily.    Marland Kitchen omeprazole (PRILOSEC)  20 MG capsule Take 1 capsule (20 mg total) by mouth daily. 30 capsule 3  . Vitamin D, Ergocalciferol, (DRISDOL) 1.25 MG (50000 UNIT) CAPS capsule Take 50,000 Units by mouth every 7 (seven) days.     No current facility-administered medications for this visit.   Allergies:  Allergies  Allergen Reactions  . Sulfamethoxazole-Trimethoprim Other (See Comments)    RASH AROUND MOUTH RASH AROUND MOUTH   . Keflex [Cephalexin] Rash   Social History: Social History   Socioeconomic History  . Marital status: Divorced    Spouse name: Not on file  . Number of children: 0  . Years of education: 36  . Highest education level: Not on file  Occupational History  . Occupation: unemployed    Comment: "back, copd , nerves"  Tobacco Use  . Smoking status: Current Every Day Smoker    Packs/day: 0.25    Types: Cigarettes    Start date: 2008  . Smokeless tobacco: Never Used  . Tobacco comment: 1/2 pack a day  Vaping Use  . Vaping Use: Never used  Substance and Sexual Activity  . Alcohol use: No  . Drug use: No  . Sexual activity: Not Currently  Other Topics Concern  . Not on file  Social History Narrative   Homeless   Lives with cousin   Is able to drive   Trying to get disability   Social Determinants of Health   Financial Resource Strain: Not on file  Food Insecurity: Not on file  Transportation Needs: Not on file  Physical Activity: Not on file  Stress: Not on file  Social Connections: Not on file       Family History:  Family History  Problem Relation Age of Onset  . Diabetes Mother   . Heart disease Mother 61  . Hypertension Mother   . Diabetes Father   . Alcohol abuse Father   . Heart disease Father 70  . Hyperlipidemia Father   . Hypertension Father   . Cancer Brother        ?lung  . Cancer Brother        BONES  . Colon cancer Neg Hx          Review of Systems  HENT: Negative.   Respiratory: Negative.   Cardiovascular: Negative.   Gastrointestinal:  Negative.   Genitourinary: Negative.   Musculoskeletal: Negative.   Neurological: Negative.   Psychiatric/Behavioral: Negative for self-injury, sleep disturbance and suicidal ideas. The patient is nervous/anxious. The patient is not hyperactive.   All other systems reviewed and are negative.  Objective: BP 128/73   Pulse 95   Temp 97.7 F (36.5 C)   Ht 5\' 6"  (1.676 m)   Wt 158 lb 3.2 oz (71.8 kg)   SpO2 98%   BMI 25.53 kg/m  Body mass index is 25.53 kg/m. Physical Exam Vitals reviewed.  Constitutional:      Appearance: Normal appearance.  HENT:     Head: Normocephalic.     Nose: Nose normal.  Eyes:     Conjunctiva/sclera: Conjunctivae normal.  Cardiovascular:     Rate and Rhythm: Normal rate and regular rhythm.     Pulses: Normal pulses.     Heart sounds: Normal heart sounds.  Pulmonary:     Effort: Pulmonary effort is  normal.     Breath sounds: Normal breath sounds.  Abdominal:     General: Bowel sounds are normal.  Musculoskeletal:        General: Normal range of motion.     Cervical back: Normal range of motion and neck supple.  Skin:    General: Skin is warm.  Neurological:     Mental Status: She is alert and oriented to person, place, and time.  Psychiatric:     Comments: Anxiety    The plan was reviewed with the patient/family, and all questions/concerned were addressed.  It was my pleasure to see Janaya today and participate in her care. Please feel free to contact me with any questions or concerns.  Sincerely,  Jac Canavan NP [place]

## 2020-10-01 ENCOUNTER — Ambulatory Visit: Payer: Medicaid Other | Admitting: Nurse Practitioner

## 2020-10-01 ENCOUNTER — Encounter: Payer: Medicaid Other | Admitting: Nurse Practitioner

## 2020-10-06 ENCOUNTER — Encounter: Payer: Medicaid Other | Admitting: Nurse Practitioner

## 2020-10-13 ENCOUNTER — Encounter: Payer: Medicaid Other | Admitting: Nurse Practitioner

## 2020-10-13 ENCOUNTER — Encounter: Payer: Self-pay | Admitting: Nurse Practitioner

## 2020-10-20 ENCOUNTER — Ambulatory Visit (INDEPENDENT_AMBULATORY_CARE_PROVIDER_SITE_OTHER): Payer: Medicaid Other | Admitting: Nurse Practitioner

## 2020-10-20 ENCOUNTER — Other Ambulatory Visit: Payer: Self-pay

## 2020-10-20 ENCOUNTER — Encounter: Payer: Self-pay | Admitting: Nurse Practitioner

## 2020-10-20 ENCOUNTER — Other Ambulatory Visit (HOSPITAL_COMMUNITY)
Admission: RE | Admit: 2020-10-20 | Discharge: 2020-10-20 | Disposition: A | Discharge status: Home / Self Care | Payer: Medicaid Other | Source: Ambulatory Visit | Attending: Nurse Practitioner | Admitting: Nurse Practitioner

## 2020-10-20 VITALS — BP 131/87 | HR 91 | Temp 97.1°F | Ht 66.0 in | Wt 158.2 lb

## 2020-10-20 DIAGNOSIS — Z0001 Encounter for general adult medical examination with abnormal findings: Secondary | ICD-10-CM | POA: Diagnosis not present

## 2020-10-20 DIAGNOSIS — Z0000 Encounter for general adult medical examination without abnormal findings: Secondary | ICD-10-CM

## 2020-10-20 DIAGNOSIS — N72 Inflammatory disease of cervix uteri: Secondary | ICD-10-CM | POA: Diagnosis not present

## 2020-10-20 NOTE — Assessment & Plan Note (Signed)
This is not new for patient but symptoms are getting worse.  On exam today patient's cervix is noted to have bright red blood, patient reports blood staining every wipe after urination.  Patient is not currently using any medication for treatment. Will refer patient to OB/GYN for further assessment.  Completed Pap and STI screening results pending.

## 2020-10-20 NOTE — Patient Instructions (Signed)
Health Maintenance, Female Adopting a healthy lifestyle and getting preventive care are important in promoting health and wellness. Ask your health care provider about:  The right schedule for you to have regular tests and exams.  Things you can do on your own to prevent diseases and keep yourself healthy. What should I know about diet, weight, and exercise? Eat a healthy diet  Eat a diet that includes plenty of vegetables, fruits, low-fat dairy products, and lean protein.  Do not eat a lot of foods that are high in solid fats, added sugars, or sodium.   Maintain a healthy weight Body mass index (BMI) is used to identify weight problems. It estimates body fat based on height and weight. Your health care provider can help determine your BMI and help you achieve or maintain a healthy weight. Get regular exercise Get regular exercise. This is one of the most important things you can do for your health. Most adults should:  Exercise for at least 150 minutes each week. The exercise should increase your heart rate and make you sweat (moderate-intensity exercise).  Do strengthening exercises at least twice a week. This is in addition to the moderate-intensity exercise.  Spend less time sitting. Even light physical activity can be beneficial. Watch cholesterol and blood lipids Have your blood tested for lipids and cholesterol at 61 years of age, then have this test every 5 years. Have your cholesterol levels checked more often if:  Your lipid or cholesterol levels are high.  You are older than 61 years of age.  You are at high risk for heart disease. What should I know about cancer screening? Depending on your health history and family history, you may need to have cancer screening at various ages. This may include screening for:  Breast cancer.  Cervical cancer.  Colorectal cancer.  Skin cancer.  Lung cancer. What should I know about heart disease, diabetes, and high blood  pressure? Blood pressure and heart disease  High blood pressure causes heart disease and increases the risk of stroke. This is more likely to develop in people who have high blood pressure readings, are of African descent, or are overweight.  Have your blood pressure checked: ? Every 3-5 years if you are 18-39 years of age. ? Every year if you are 40 years old or older. Diabetes Have regular diabetes screenings. This checks your fasting blood sugar level. Have the screening done:  Once every three years after age 40 if you are at a normal weight and have a low risk for diabetes.  More often and at a younger age if you are overweight or have a high risk for diabetes. What should I know about preventing infection? Hepatitis B If you have a higher risk for hepatitis B, you should be screened for this virus. Talk with your health care provider to find out if you are at risk for hepatitis B infection. Hepatitis C Testing is recommended for:  Everyone born from 1945 through 1965.  Anyone with known risk factors for hepatitis C. Sexually transmitted infections (STIs)  Get screened for STIs, including gonorrhea and chlamydia, if: ? You are sexually active and are younger than 61 years of age. ? You are older than 61 years of age and your health care provider tells you that you are at risk for this type of infection. ? Your sexual activity has changed since you were last screened, and you are at increased risk for chlamydia or gonorrhea. Ask your health care provider   if you are at risk.  Ask your health care provider about whether you are at high risk for HIV. Your health care provider may recommend a prescription medicine to help prevent HIV infection. If you choose to take medicine to prevent HIV, you should first get tested for HIV. You should then be tested every 3 months for as long as you are taking the medicine. Pregnancy  If you are about to stop having your period (premenopausal) and  you may become pregnant, seek counseling before you get pregnant.  Take 400 to 800 micrograms (mcg) of folic acid every day if you become pregnant.  Ask for birth control (contraception) if you want to prevent pregnancy. Osteoporosis and menopause Osteoporosis is a disease in which the bones lose minerals and strength with aging. This can result in bone fractures. If you are 65 years old or older, or if you are at risk for osteoporosis and fractures, ask your health care provider if you should:  Be screened for bone loss.  Take a calcium or vitamin D supplement to lower your risk of fractures.  Be given hormone replacement therapy (HRT) to treat symptoms of menopause. Follow these instructions at home: Lifestyle  Do not use any products that contain nicotine or tobacco, such as cigarettes, e-cigarettes, and chewing tobacco. If you need help quitting, ask your health care provider.  Do not use street drugs.  Do not share needles.  Ask your health care provider for help if you need support or information about quitting drugs. Alcohol use  Do not drink alcohol if: ? Your health care provider tells you not to drink. ? You are pregnant, may be pregnant, or are planning to become pregnant.  If you drink alcohol: ? Limit how much you use to 0-1 drink a day. ? Limit intake if you are breastfeeding.  Be aware of how much alcohol is in your drink. In the [place] one drink equals one 12 oz bottle of beer (355 mL), one 5 oz glass of wine (148 mL), or one 1 oz glass of hard liquor (44 mL). General instructions  Schedule regular health, dental, and eye exams.  Stay current with your vaccines.  Tell your health care provider if: ? You often feel depressed. ? You have ever been abused or do not feel safe at home. Summary  Adopting a healthy lifestyle and getting preventive care are important in promoting health and wellness.  Follow your health care provider's instructions about healthy  diet, exercising, and getting tested or screened for diseases.  Follow your health care provider's instructions on monitoring your cholesterol and blood pressure. This information is not intended to replace advice given to you by your health care provider. Make sure you discuss any questions you have with your health care provider. Document Revised: 08/07/2018 Document Reviewed: 08/07/2018 Elsevier Patient Education  2021 Elsevier Inc.  

## 2020-10-20 NOTE — Assessment & Plan Note (Signed)
Completed annual physical exam, health maintenance and preventative care education provided to patient with printed handouts given.  Will refer patient to OB/GYN for bleeding cervix [cervicitis]

## 2020-10-20 NOTE — Progress Notes (Signed)
Established Patient Office Visit  Subjective:  Patient ID: Nancy Delacruz, female    DOB: 08-14-1961  Age: 60 y.o. MRN: [id number]  CC:  Chief Complaint  Patient presents with  . Annual Exam    HPI [place]   Encounter for general adult medical examination Physical: Patient's last physical exam was 1 year ago .  Weight:  Appropriate for height (BMI less than 27%) ;  Blood Pressure: Normal (BP less than 120/80) ;  Medical History: Patient history reviewed ; Family history reviewed ;  Allergies Reviewed: No change in current allergies ;  Medications Reviewed: Medications reviewed - no changes ;  Lipids: Normal lipid levels ;labs completed  Smoking: Life-long smoker ;  Physical Activity: Exercises at least 3 times per week ; As tolerated Alcohol/Drug Use: Is a non-drinker ; No illicit drug use ;  Patient is not afflicted from Stress Incontinence and Urge Incontinence  Safety: reviewed ; Patient wears a seat belt, has smoke detectors, has carbon monoxide detectors, and does not  wears sunscreen with extended sun exposure. Dental Care: biannual cleanings, brushes and flosses daily. Ophthalmology/Optometry: Annual visit.  Hearing loss: none Vision impairments: none ( use readers)  Past Medical History:  Diagnosis Date  . Allergy   . Anemia   . Anxiety   . Arthritis   . Bipolar 1 disorder ([place])   . Clotting disorder ([place])    had a blood clot near liver, one time  . COPD (chronic obstructive pulmonary disease) ([place])   . Depression   . GERD (gastroesophageal reflux disease)   . History of kidney stones     Past Surgical History:  Procedure Laterality Date  . BIOPSY  09/12/2016   Procedure: BIOPSY;  Surgeon: Danie Binder, MD;  Location: [facility];  Service: Endoscopy;;  random colon bx's, duodenal bx's, gastric bx's  . CHOLECYSTECTOMY  2005  . COLONOSCOPY WITH PROPOFOL N/A 09/12/2016   Dr. Oneida Alar: Random colon biopsies negative, external hemorrhoids, two 3-5 mm  polyps in the transverse colon removed were tubular adenomas. Next colonoscopy in 5 years  . ESOPHAGOGASTRODUODENOSCOPY (EGD) WITH PROPOFOL N/A 09/12/2016   Dr. Oneida Alar: Benign-appearing peptic stricture status post dilation, mild erosive gastritis with benign biopsies. Small bowel biopsies negative for celiac.  . HAND SURGERY     left, tendon  . POLYPECTOMY  09/12/2016   Procedure: POLYPECTOMY;  Surgeon: Danie Binder, MD;  Location: [facility];  Service: Endoscopy;;  transverse colon polyps x2  . SAVORY DILATION N/A 09/12/2016   Procedure: SAVORY DILATION;  Surgeon: Danie Binder, MD;  Location: [facility];  Service: Endoscopy;  Laterality: N/A;    Family History  Problem Relation Age of Onset  . Diabetes Mother   . Heart disease Mother 80  . Hypertension Mother   . Diabetes Father   . Alcohol abuse Father   . Heart disease Father 62  . Hyperlipidemia Father   . Hypertension Father   . Cancer Brother        ?lung  . Cancer Brother        BONES  . Colon cancer Neg Hx     Social History   Socioeconomic History  . Marital status: Divorced    Spouse name: Not on file  . Number of children: 0  . Years of education: 17  . Highest education level: Not on file  Occupational History  . Occupation: unemployed    Comment: "back, copd , nerves"  Tobacco  Use  . Smoking status: Current Every Day Smoker    Packs/day: 0.25    Types: Cigarettes    Start date: 2008  . Smokeless tobacco: Never Used  . Tobacco comment: 1/2 pack a day  Vaping Use  . Vaping Use: Never used  Substance and Sexual Activity  . Alcohol use: No  . Drug use: No  . Sexual activity: Not Currently  Other Topics Concern  . Not on file  Social History Narrative   Homeless   Lives with cousin   Is able to drive   Trying to get disability   Social Determinants of Health   Financial Resource Strain: Not on file  Food Insecurity: Not on file  Transportation Needs: Not on file  Physical Activity: Not  on file  Stress: Not on file  Social Connections: Not on file  Intimate Partner Violence: Not on file    Outpatient Medications Prior to Visit  Medication Sig Dispense Refill  . busPIRone (BUSPAR) 10 MG tablet Take 10 mg by mouth 3 (three) times daily.    Marland Kitchen HYDROcodone-acetaminophen (NORCO) 10-325 MG tablet 1 tablet every 6 (six) hours as needed.  0  . lisdexamfetamine (VYVANSE) 70 MG capsule Take 70 mg by mouth daily.    Marland Kitchen omeprazole (PRILOSEC) 20 MG capsule Take 1 capsule (20 mg total) by mouth daily. 30 capsule 3  . Vitamin D, Ergocalciferol, (DRISDOL) 1.25 MG (50000 UNIT) CAPS capsule Take 50,000 Units by mouth every 7 (seven) days. (Patient not taking: Reported on 10/20/2020)     No facility-administered medications prior to visit.    Allergies  Allergen Reactions  . Sulfamethoxazole-Trimethoprim Other (See Comments)    RASH AROUND MOUTH RASH AROUND MOUTH   . Keflex [Cephalexin] Rash    ROS Review of Systems  Constitutional: Negative.   HENT: Negative.   Respiratory: Negative.   Cardiovascular: Negative.   Gastrointestinal: Negative.   Genitourinary: Positive for vaginal bleeding and vaginal pain. Negative for difficulty urinating, flank pain, frequency and pelvic pain.       Cervicitis  Musculoskeletal: Negative.   Skin: Negative.   Neurological: Negative.   All other systems reviewed and are negative.     Objective:    Physical Exam Vitals reviewed.  Constitutional:      Appearance: Normal appearance.  HENT:     Head: Normocephalic.     Nose: Nose normal.     Mouth/Throat:     Mouth: Mucous membranes are moist.     Pharynx: Oropharynx is clear.  Eyes:     Conjunctiva/sclera: Conjunctivae normal.     Pupils: Pupils are equal, round, and reactive to light.  Cardiovascular:     Rate and Rhythm: Normal rate and regular rhythm.     Pulses: Normal pulses.     Heart sounds: Normal heart sounds.  Pulmonary:     Effort: Pulmonary effort is normal.   Abdominal:     General: There is distension.  Genitourinary:    Vagina: Vaginal discharge present.     Comments: Vaginal bleeding Musculoskeletal:        General: Normal range of motion.  Skin:    General: Skin is dry.  Neurological:     Mental Status: She is alert and oriented to person, place, and time.  Psychiatric:        Behavior: Behavior normal.     BP 131/87   Pulse 91   Temp (!) 97.1 F (36.2 C)   Ht 5\' 6"  (1.676 m)  Wt 158 lb 3.2 oz (71.8 kg)   SpO2 97%   BMI 25.53 kg/m  Wt Readings from Last 3 Encounters:  10/20/20 158 lb 3.2 oz (71.8 kg)  09/17/20 158 lb 3.2 oz (71.8 kg)  09/17/17 153 lb 4 oz (69.5 kg)     Health Maintenance Due  Topic Date Due  . MAMMOGRAM  03/09/2019  . PAP SMEAR-Modifier  06/22/2020     Lab Results  Component Value Date   TSH 0.55 06/22/2017   Lab Results  Component Value Date   WBC 6.4 06/22/2017   HGB 11.9 06/22/2017   HCT 35.2 06/22/2017   MCV 89.6 06/22/2017   PLT 304 06/22/2017   Lab Results  Component Value Date   NA 141 06/22/2017   K 3.7 06/22/2017   CO2 27 06/22/2017   GLUCOSE 100 06/22/2017   BUN 8 06/22/2017   CREATININE 0.90 06/22/2017   BILITOT 0.4 06/22/2017   ALKPHOS 77 12/21/2016   AST 16 06/22/2017   ALT 12 06/22/2017   PROT 6.7 06/22/2017   ALBUMIN 4.5 12/21/2016   CALCIUM 8.8 06/22/2017   ANIONGAP 9 12/21/2016   Lab Results  Component Value Date   CHOL 201 (H) 06/22/2017   Lab Results  Component Value Date   HDL 53 06/22/2017   Lab Results  Component Value Date   LDLCALC 107 (H) 06/22/2017   Lab Results  Component Value Date   TRIG 304 (H) 06/22/2017   Lab Results  Component Value Date   CHOLHDL 3.8 06/22/2017      Assessment & Plan:   Problem List Items Addressed This Visit      Genitourinary   Cervicitis    This is not new for patient but symptoms are getting worse.  On exam today patient's cervix is noted to have bright red blood, patient reports blood staining  every wipe after urination.  Patient is not currently using any medication for treatment. Will refer patient to OB/GYN for further assessment.  Completed Pap and STI screening results pending.       Relevant Orders   Ambulatory referral to Obstetrics / Gynecology     Other   Physical exam, annual - Primary    Completed annual physical exam, health maintenance and preventative care education provided to patient with printed handouts given.  Will refer patient to OB/GYN for bleeding cervix [cervicitis]      Relevant Orders   MM Digital Screening   Cytology - PAP([place])   CBC with Differential   Comprehensive metabolic panel   Lipid Panel   Vitamin D, 25-hydroxy   GC/Chlamydia probe amp ([place])not at [facility]   Urine cytology ancillary only      No orders of the defined types were placed in this encounter.   Follow-up: No follow-ups on file.    Ivy Lynn, NP

## 2020-10-21 LAB — LIPID PANEL
Chol/HDL Ratio: 5.2 ratio — ABNORMAL HIGH (ref 0.0–4.4)
Cholesterol, Total: 264 mg/dL — ABNORMAL HIGH (ref 100–199)
HDL: 51 mg/dL (ref >39)
LDL Chol Calc (NIH): 169 mg/dL — ABNORMAL HIGH (ref 0–99)
Triglycerides: 234 mg/dL — ABNORMAL HIGH (ref 0–149)
VLDL Cholesterol Cal: 44 mg/dL — ABNORMAL HIGH (ref 5–40)

## 2020-10-21 LAB — CBC WITH DIFFERENTIAL/PLATELET
Basophils Absolute: 0.1 10*3/uL (ref 0.0–0.2)
Basos: 1 %
EOS (ABSOLUTE): 0.3 10*3/uL (ref 0.0–0.4)
Eos: 4 %
Hematocrit: 36 % (ref 34.0–46.6)
Hemoglobin: 12.4 g/dL (ref 11.1–15.9)
Immature Grans (Abs): 0 10*3/uL (ref 0.0–0.1)
Immature Granulocytes: 0 %
Lymphocytes Absolute: 3.1 10*3/uL (ref 0.7–3.1)
Lymphs: 34 %
MCH: 31.5 pg (ref 26.6–33.0)
MCHC: 34.4 g/dL (ref 31.5–35.7)
MCV: 91 fL (ref 79–97)
Monocytes Absolute: 0.7 10*3/uL (ref 0.1–0.9)
Monocytes: 8 %
Neutrophils Absolute: 4.9 10*3/uL (ref 1.4–7.0)
Neutrophils: 53 %
Platelets: 323 10*3/uL (ref 150–450)
RBC: 3.94 x10E6/uL (ref 3.77–5.28)
RDW: 13.2 % (ref 11.7–15.4)
WBC: 9.1 10*3/uL (ref 3.4–10.8)

## 2020-10-21 LAB — VITAMIN D 25 HYDROXY (VIT D DEFICIENCY, FRACTURES): Vit D, 25-Hydroxy: 35.4 ng/mL (ref 30.0–100.0)

## 2020-10-21 LAB — COMPREHENSIVE METABOLIC PANEL
ALT: 19 IU/L (ref 0–32)
AST: 25 IU/L (ref 0–40)
Albumin/Globulin Ratio: 1.8 (ref 1.2–2.2)
Albumin: 4.5 g/dL (ref 3.8–4.9)
Alkaline Phosphatase: 89 IU/L (ref 44–121)
BUN/Creatinine Ratio: 9 — ABNORMAL LOW (ref 12–28)
BUN: 8 mg/dL (ref 8–27)
Bilirubin Total: <0.2 mg/dL (ref 0.0–1.2)
CO2: 21 mmol/L (ref 20–29)
Calcium: 8.9 mg/dL (ref 8.7–10.3)
Chloride: 106 mmol/L (ref 96–106)
Creatinine, Ser: 0.86 mg/dL (ref 0.57–1.00)
GFR calc Af Amer: 85 mL/min/{1.73_m2} (ref >59)
GFR calc non Af Amer: 74 mL/min/{1.73_m2} (ref >59)
Globulin, Total: 2.5 g/dL (ref 1.5–4.5)
Glucose: 92 mg/dL (ref 65–99)
Potassium: 4.1 mmol/L (ref 3.5–5.2)
Sodium: 144 mmol/L (ref 134–144)
Total Protein: 7 g/dL (ref 6.0–8.5)

## 2020-10-22 LAB — CYTOLOGY - PAP: Diagnosis: NEGATIVE

## 2020-10-26 ENCOUNTER — Ambulatory Visit (INDEPENDENT_AMBULATORY_CARE_PROVIDER_SITE_OTHER): Payer: Medicaid Other | Admitting: Family Medicine

## 2020-10-26 ENCOUNTER — Encounter: Payer: Self-pay | Admitting: Family Medicine

## 2020-10-26 DIAGNOSIS — J441 Chronic obstructive pulmonary disease with (acute) exacerbation: Secondary | ICD-10-CM

## 2020-10-26 MED ORDER — PREDNISONE 20 MG PO TABS: ORAL_TABLET | ORAL | 0 refills | Status: DC

## 2020-10-26 MED ORDER — AMOXICILLIN 500 MG PO CAPS
500.0000 mg | ORAL_CAPSULE | Freq: Two times a day (BID) | ORAL | 0 refills | Status: DC
Start: 2020-10-26 — End: 2020-11-10

## 2020-10-26 NOTE — Progress Notes (Signed)
Virtual Visit via telephone Note  I connected with Nancy Delacruz on 10/26/20 at 1730 by telephone and verified that I am speaking with the correct person using two identifiers. Nancy Delacruz [facility] is currently located at home and patient are currently with her during visit. The provider, Fransisca Kaufmann Michelene Keniston, MD is located in their office at time of visit.  Call ended at 1736  I discussed the limitations, risks, security and privacy concerns of performing an evaluation and management service by telephone and the availability of in person appointments. I also discussed with the patient that there may be a patient responsible charge related to this service. The patient expressed understanding and agreed to proceed.   History and Present Illness: Patient is having sinus drainage and congestion and drainage in her throat.  She is coughing and wheezing.  She denies any fevers or chills or shortness of breath.  She does have copd and history of bronchitis.  She feels like it has been going on for more the past 24 hours.  She denies sick contacts.  She denies loss of taste or smell. She is using inhalers but has not been using them.   1. COPD exacerbation ([place])     Outpatient Encounter Medications as of 10/26/2020  Medication Sig  . amoxicillin (AMOXIL) 500 MG capsule Take 1 capsule (500 mg total) by mouth 2 (two) times daily.  . predniSONE (DELTASONE) 20 MG tablet 2 po at same time daily for 5 days  . busPIRone (BUSPAR) 10 MG tablet Take 10 mg by mouth 3 (three) times daily.  Marland Kitchen HYDROcodone-acetaminophen (NORCO) 10-325 MG tablet 1 tablet every 6 (six) hours as needed.  Marland Kitchen lisdexamfetamine (VYVANSE) 70 MG capsule Take 70 mg by mouth daily.  Marland Kitchen omeprazole (PRILOSEC) 20 MG capsule Take 1 capsule (20 mg total) by mouth daily.  . Vitamin D, Ergocalciferol, (DRISDOL) 1.25 MG (50000 UNIT) CAPS capsule Take 50,000 Units by mouth every 7 (seven) days. (Patient not taking: Reported on 10/20/2020)   No  facility-administered encounter medications on file as of 10/26/2020.    Review of Systems  Constitutional: Negative for chills and fever.  HENT: Positive for congestion, postnasal drip, rhinorrhea, sinus pressure and sneezing. Negative for ear discharge, ear pain and sore throat.   Eyes: Negative for pain, redness and visual disturbance.  Respiratory: Positive for cough and wheezing. Negative for chest tightness and shortness of breath.   Cardiovascular: Negative for chest pain and leg swelling.  Genitourinary: Negative for difficulty urinating and dysuria.  Musculoskeletal: Negative for back pain and gait problem.  Skin: Negative for rash.  Neurological: Negative for light-headedness and headaches.  Psychiatric/Behavioral: Negative for agitation and behavioral problems.  All other systems reviewed and are negative.   Observations/Objective: Patient sounds comfortable and in no acute distress  Assessment and Plan: Problem List Items Addressed This Visit   None   Visit Diagnoses    COPD exacerbation ([place])    -  Primary   Relevant Medications   predniSONE (DELTASONE) 20 MG tablet   amoxicillin (AMOXIL) 500 MG capsule      Will treat like COPD exacerbation she says she will do home Covid test. Follow up plan: Return if symptoms worsen or fail to improve.     I discussed the assessment and treatment plan with the patient. The patient was provided an opportunity to ask questions and all were answered. The patient agreed with the plan and demonstrated an understanding of the instructions.   The patient  was advised to call back or seek an in-person evaluation if the symptoms worsen or if the condition fails to improve as anticipated.  The above assessment and management plan was discussed with the patient. The patient verbalized understanding of and has agreed to the management plan. Patient is aware to call the clinic if symptoms persist or worsen. Patient is aware when to return to  the clinic for a follow-up visit. Patient educated on when it is appropriate to go to the emergency department.    I provided 6 minutes of non-face-to-face time during this encounter.    Worthy Rancher, MD

## 2020-11-10 ENCOUNTER — Other Ambulatory Visit: Payer: Self-pay

## 2020-11-10 ENCOUNTER — Ambulatory Visit (INDEPENDENT_AMBULATORY_CARE_PROVIDER_SITE_OTHER): Payer: Medicaid Other | Admitting: Adult Health

## 2020-11-10 ENCOUNTER — Encounter: Payer: Self-pay | Admitting: Adult Health

## 2020-11-10 VITALS — BP 129/79 | HR 95 | Ht 65.5 in | Wt 159.5 lb

## 2020-11-10 DIAGNOSIS — R140 Abdominal distension (gaseous): Secondary | ICD-10-CM | POA: Diagnosis not present

## 2020-11-10 DIAGNOSIS — N952 Postmenopausal atrophic vaginitis: Secondary | ICD-10-CM | POA: Insufficient documentation

## 2020-11-10 DIAGNOSIS — N362 Urethral caruncle: Secondary | ICD-10-CM | POA: Diagnosis not present

## 2020-11-10 DIAGNOSIS — N950 Postmenopausal bleeding: Secondary | ICD-10-CM

## 2020-11-10 NOTE — Progress Notes (Signed)
  Subjective:     Patient ID: Nancy Delacruz, female   DOB: 08/18/1961, 60 y.o.   MRN: [id number]  HPI Nancy Delacruz is a 60 year old white female, divorced, she says she wiped blood, she had pap in February and it was negative and she said it was uncomfortable, and provider noted blood at cervix. PCP is [facility] [place]   Review of Systems Vaginal bleeding +bloating  Has some urinary frequency at times   Reviewed past medical,surgical, social and family history. Reviewed medications and allergies.     Objective:   Physical Exam BP 129/79 (BP Location: Left Arm, Patient Position: Sitting, Cuff Size: Normal)   Pulse 95   Ht 5' 5.5" (1.664 m)   Wt 159 lb 8 oz (72.3 kg)   BMI 26.14 kg/m    Skin warm and dry.Pelvic: external genitalia is normal in appearance no lesions, vagina:pale with some atrophy, urethra has small urethral caruncle, cervix is pale, no blood noted today, no CMT, uterus: normal size, shape and contour, non tender, no masses felt, adnexa: no masses or tenderness noted. Bladder is non tender and no masses felt.  AA is 0 Fall risk is low PHQ 9 score is 0 GAD 7 score is 0  [place] - 11/10/20 1120      Pregnancy Intention Screening   Does the patient want to become pregnant in the next year? N/A    Does the patient's partner want to become pregnant in the next year? N/A    Would the patient like to discuss contraceptive options today? N/A      Contraception Wrap Up   Current Method No Method - Other Reason   postmenopausal   End Method No Method - Other Reason   postmenopausal   Contraception Counseling Provided No          Assessment:     1. PMB (postmenopausal bleeding) Will get GYN [place] to assess uterus and ovaries  and then see me after   2. Urethral caruncle Showed her a picture of one in [facility]  3. Vaginal atrophy May add PVC if [place] normal  4. Bloating Will get GYN [place]     Plan:     Get GYN [place] and see me in about 3 weeks

## 2020-12-08 ENCOUNTER — Ambulatory Visit: Payer: Medicaid Other | Admitting: Nurse Practitioner

## 2020-12-08 ENCOUNTER — Ambulatory Visit: Payer: Medicaid Other | Admitting: Adult Health

## 2020-12-08 ENCOUNTER — Encounter: Payer: Self-pay | Admitting: Nurse Practitioner

## 2020-12-08 ENCOUNTER — Other Ambulatory Visit: Payer: Self-pay

## 2020-12-08 ENCOUNTER — Other Ambulatory Visit: Payer: Medicaid Other

## 2020-12-08 VITALS — BP 131/84 | HR 98 | Temp 97.7°F | Ht 65.0 in | Wt 158.0 lb

## 2020-12-08 DIAGNOSIS — R208 Other disturbances of skin sensation: Secondary | ICD-10-CM | POA: Insufficient documentation

## 2020-12-08 DIAGNOSIS — R5383 Other fatigue: Secondary | ICD-10-CM | POA: Diagnosis not present

## 2020-12-08 MED ORDER — GABAPENTIN 100 MG PO CAPS
100.0000 mg | ORAL_CAPSULE | Freq: Three times a day (TID) | ORAL | 0 refills | Status: DC
Start: 2020-12-08 — End: 2021-04-04

## 2020-12-08 NOTE — Assessment & Plan Note (Signed)
Worsening fatigue in the last 1 week.  This is new for patient.  Completed B12, CBC, TSH.  Follow-up with worsening uncontrolled symptoms.  Education provided to patient with printed handouts given

## 2020-12-08 NOTE — Assessment & Plan Note (Addendum)
Patient is reporting lessening tingling and burning bilateral lower extremity in the last 1 week on assessment symptoms are similar to peripheral neuropathy.  Started patient on gabapentin follow-up in 2 to 4 weeks.  Education provided to patient with printed handouts given.  Rx sent to pharmacy.

## 2020-12-08 NOTE — Progress Notes (Signed)
Acute Office Visit  Subjective:    Patient ID: Greg Eckrich [facility], female    DOB: 04/24/1961, 60 y.o.   MRN: [id number]  No chief complaint on file.   HPI Patient is in today for Pain & Burning   She reports new onset bilateral feet pain. was not an injury that may have caused the pain. The pain started about a week ago and is worsening. The pain and burning does not radiate . The pain is described as aching, burning and tingling, is severe in intensity, occurring constantly. Symptoms are worse in the: morning, mid-day, afternoon, evening  Aggravating factors: none Relieving factors: none.  She has tried prescription pain relievers with no relief.   ---------------------------------------------------------------------------------------------------        Fatigue  She reports new onset fatigue which she describes as a lack of energy, feeling exhausted and feeling weak. It began a few weeks ago and occurs all the time. It is described as severe and gradually worsening. She has not started new medications around the time the fatigue started.   Associated symptoms: Yes arthralgias No bleeding  No melena No chest discomfort  No heart palpitations No heart racing   No dyspnea No feeling depressed  No feeling anxious or under stress No fevers  No loss of appetite No nausea  No vomiting No sleeping problems    Wt Readings from Last 3 Encounters:  12/08/20 158 lb (71.7 kg)  11/10/20 159 lb 8 oz (72.3 kg)  10/20/20 158 lb 3.2 oz (71.8 kg)    Lab Results  Component Value Date   WBC 9.1 10/20/2020   HGB 12.4 10/20/2020   HCT 36.0 10/20/2020   MCV 91 10/20/2020   PLT 323 10/20/2020   Lab Results  Component Value Date   TSH 0.55 06/22/2017   Lab Results  Component Value Date   NA 144 10/20/2020   K 4.1 10/20/2020   CO2 21 10/20/2020   BUN 8 10/20/2020   CREATININE 0.86 10/20/2020   CALCIUM 8.9 10/20/2020   GLUCOSE 92 10/20/2020      ---------------------------------------------------------------------------------------------------  Past Medical History:  Diagnosis Date  . Allergy   . Anemia   . Anxiety   . Arthritis   . Bipolar 1 disorder ([place])   . Clotting disorder ([place])    had a blood clot near liver, one time  . COPD (chronic obstructive pulmonary disease) ([place])   . Depression   . GERD (gastroesophageal reflux disease)   . History of kidney stones     Past Surgical History:  Procedure Laterality Date  . BIOPSY  09/12/2016   Procedure: BIOPSY;  Surgeon: Danie Binder, MD;  [facility];  Service: Endoscopy;;  random colon bx's, duodenal bx's, gastric bx's  . CHOLECYSTECTOMY  2005  . COLONOSCOPY WITH PROPOFOL N/A 09/12/2016   Dr. Oneida Alar: Random colon biopsies negative, external hemorrhoids, two 3-5 mm polyps in the transverse colon removed were tubular adenomas. Next colonoscopy in 5 years  . ESOPHAGOGASTRODUODENOSCOPY (EGD) WITH PROPOFOL N/A 09/12/2016   Dr. Oneida Alar: Benign-appearing peptic stricture status post dilation, mild erosive gastritis with benign biopsies. Small bowel biopsies negative for celiac.  . HAND SURGERY     left, tendon  . POLYPECTOMY  09/12/2016   Procedure: POLYPECTOMY;  Surgeon: Danie Binder, MD;  [facility];  Service: Endoscopy;;  transverse colon polyps x2  . SAVORY DILATION N/A 09/12/2016   Procedure: SAVORY DILATION;  Surgeon: Danie Binder, MD;  Location: [facility];  Service: Endoscopy;  Laterality: N/A;    Family History  Problem Relation Age of Onset  . Diabetes Mother   . Heart disease Mother 65  . Hypertension Mother   . Diabetes Father   . Alcohol abuse Father   . Heart disease Father 48  . Hyperlipidemia Father   . Hypertension Father   . Cancer Brother        ?lung  . Cancer Brother        BONES  . Colon cancer Neg Hx     Social History   Socioeconomic History  . Marital status: Divorced    Spouse name: Not on file  .  Number of children: 0  . Years of education: 36  . Highest education level: Not on file  Occupational History  . Occupation: unemployed    Comment: "back, copd , nerves"  Tobacco Use  . Smoking status: Current Some Day Smoker    Packs/day: 0.25    Types: Cigarettes    Start date: 2008  . Smokeless tobacco: Never Used  Vaping Use  . Vaping Use: Never used  Substance and Sexual Activity  . Alcohol use: No  . Drug use: No  . Sexual activity: Not Currently    Birth control/protection: Post-menopausal  Other Topics Concern  . Not on file  Social History Narrative   Homeless   Lives with cousin   Is able to drive   Trying to get disability   Social Determinants of Health   Financial Resource Strain: Low Risk   . Difficulty of Paying Living Expenses: Not very hard  Food Insecurity: No Food Insecurity  . Worried About Charity fundraiser in the Last Year: Never true  . Ran Out of Food in the Last Year: Never true  Transportation Needs: No Transportation Needs  . Lack of Transportation (Medical): No  . Lack of Transportation (Non-Medical): No  Physical Activity: Inactive  . Days of Exercise per Week: 0 days  . Minutes of Exercise per Session: 100 min  Stress: No Stress Concern Present  . Feeling of Stress : Not at all  Social Connections: Socially Isolated  . Frequency of Communication with Friends and Family: More than three times a week  . Frequency of Social Gatherings with Friends and Family: More than three times a week  . Attends Religious Services: Never  . Active Member of Clubs or Organizations: No  . Attends Archivist Meetings: Never  . Marital Status: Divorced  Human resources officer Violence: Not At Risk  . Fear of Current or Ex-Partner: No  . Emotionally Abused: No  . Physically Abused: No  . Sexually Abused: No    Outpatient Medications Prior to Visit  Medication Sig Dispense Refill  . amphetamine-dextroamphetamine (ADDERALL) 5 MG tablet Take 5 mg  by mouth daily.    . busPIRone (BUSPAR) 10 MG tablet Take 10 mg by mouth 3 (three) times daily.    . butalbital-acetaminophen-caffeine (FIORICET) 50-325-40 MG tablet Take 1 tablet by mouth 2 (two) times daily as needed.    Marland Kitchen HYDROcodone-acetaminophen (NORCO) 10-325 MG tablet 1 tablet every 6 (six) hours as needed.  0  . lisdexamfetamine (VYVANSE) 70 MG capsule Take 70 mg by mouth daily.    Marland Kitchen omeprazole (PRILOSEC) 20 MG capsule Take 1 capsule (20 mg total) by mouth daily. 30 capsule 3  . Vitamin D, Ergocalciferol, (DRISDOL) 1.25 MG (50000 UNIT) CAPS capsule Take 50,000 Units by mouth every 7 (seven) days. (Patient  not taking: Reported on 11/10/2020)     No facility-administered medications prior to visit.    Allergies  Allergen Reactions  . Sulfamethoxazole-Trimethoprim Other (See Comments)    RASH AROUND MOUTH RASH AROUND MOUTH   . Keflex [Cephalexin] Rash    Review of Systems  Constitutional: Negative.   HENT: Negative.   Respiratory: Negative.   Cardiovascular: Negative.   Genitourinary: Negative.   Musculoskeletal: Positive for arthralgias and myalgias.  Skin: Negative.   All other systems reviewed and are negative.      Objective:    Physical Exam Constitutional:      Appearance: Normal appearance.  HENT:     Head: Normocephalic.     Nose: Nose normal.  Eyes:     Conjunctiva/sclera: Conjunctivae normal.  Cardiovascular:     Rate and Rhythm: Normal rate and regular rhythm.     Pulses: Normal pulses.     Heart sounds: Normal heart sounds.  Pulmonary:     Effort: Pulmonary effort is normal.     Breath sounds: Normal breath sounds.  Abdominal:     General: Bowel sounds are normal.  Musculoskeletal:        General: No deformity or signs of injury.     Cervical back: Normal range of motion.     Right lower leg: No edema.     Left lower leg: No edema.  Skin:    General: Skin is dry.  Neurological:     Mental Status: She is alert and oriented to person, place,  and time.  Psychiatric:        Behavior: Behavior normal.     BP 131/84   Pulse 98   Temp 97.7 F (36.5 C) (Temporal)   Ht 5\' 5"  (1.651 m)   Wt 158 lb (71.7 kg)   SpO2 95%   BMI 26.29 kg/m  Wt Readings from Last 3 Encounters:  12/08/20 158 lb (71.7 kg)  11/10/20 159 lb 8 oz (72.3 kg)  10/20/20 158 lb 3.2 oz (71.8 kg)    Health Maintenance Due  Topic Date Due  . MAMMOGRAM  03/09/2019    There are no preventive care reminders to display for this patient.   Lab Results  Component Value Date   TSH 0.55 06/22/2017   Lab Results  Component Value Date   WBC 9.1 10/20/2020   HGB 12.4 10/20/2020   HCT 36.0 10/20/2020   MCV 91 10/20/2020   PLT 323 10/20/2020   Lab Results  Component Value Date   NA 144 10/20/2020   K 4.1 10/20/2020   CO2 21 10/20/2020   GLUCOSE 92 10/20/2020   BUN 8 10/20/2020   CREATININE 0.86 10/20/2020   BILITOT <0.2 10/20/2020   ALKPHOS 89 10/20/2020   AST 25 10/20/2020   ALT 19 10/20/2020   PROT 7.0 10/20/2020   ALBUMIN 4.5 10/20/2020   CALCIUM 8.9 10/20/2020   ANIONGAP 9 12/21/2016   Lab Results  Component Value Date   CHOL 264 (H) 10/20/2020   Lab Results  Component Value Date   HDL 51 10/20/2020   Lab Results  Component Value Date   LDLCALC 169 (H) 10/20/2020   Lab Results  Component Value Date   TRIG 234 (H) 10/20/2020   Lab Results  Component Value Date   CHOLHDL 5.2 (H) 10/20/2020   No results found for: HGBA1C     Assessment & Plan:   Problem List Items Addressed This Visit      Other   Other  fatigue    Worsening fatigue in the last 1 week.  This is new for patient.  Completed B12, CBC, TSH.  Follow-up with worsening uncontrolled symptoms.  Education provided to patient with printed handouts given      Relevant Orders   CBC with Differential   Vitamin B12   TSH   Burning sensation of feet - Primary    Patient is reporting lessening tingling and burning bilateral lower extremity in the last 1 week on  assessment symptoms are similar to peripheral neuropathy.  Started patient on gabapentin follow-up in 2 to 4 weeks.  Education provided to patient with printed handouts given.  Rx sent to pharmacy.      Relevant Medications   gabapentin (NEURONTIN) 100 MG capsule   Other Relevant Orders   CBC with Differential       Meds ordered this encounter  Medications  . gabapentin (NEURONTIN) 100 MG capsule    Sig: Take 1 capsule (100 mg total) by mouth 3 (three) times daily.    Dispense:  90 capsule    Refill:  0    Order Specific Question:   Supervising Provider    Answer:   Janora Norlander [[id number]]     Ivy Lynn, NP

## 2020-12-08 NOTE — Patient Instructions (Signed)

## 2020-12-09 LAB — CBC WITH DIFFERENTIAL/PLATELET
Basophils Absolute: 0.1 10*3/uL (ref 0.0–0.2)
Basos: 2 %
EOS (ABSOLUTE): 0.3 10*3/uL (ref 0.0–0.4)
Eos: 3 %
Hematocrit: 38.5 % (ref 34.0–46.6)
Hemoglobin: 12.9 g/dL (ref 11.1–15.9)
Immature Grans (Abs): 0 10*3/uL (ref 0.0–0.1)
Immature Granulocytes: 0 %
Lymphocytes Absolute: 2.4 10*3/uL (ref 0.7–3.1)
Lymphs: 33 %
MCH: 30.1 pg (ref 26.6–33.0)
MCHC: 33.5 g/dL (ref 31.5–35.7)
MCV: 90 fL (ref 79–97)
Monocytes Absolute: 0.5 10*3/uL (ref 0.1–0.9)
Monocytes: 7 %
Neutrophils Absolute: 4.1 10*3/uL (ref 1.4–7.0)
Neutrophils: 55 %
Platelets: 324 10*3/uL (ref 150–450)
RBC: 4.28 x10E6/uL (ref 3.77–5.28)
RDW: 12.6 % (ref 11.7–15.4)
WBC: 7.4 10*3/uL (ref 3.4–10.8)

## 2020-12-09 LAB — VITAMIN B12: Vitamin B-12: 294 pg/mL (ref 232–1245)

## 2020-12-09 LAB — TSH: TSH: 0.607 u[IU]/mL (ref 0.450–4.500)

## 2020-12-13 ENCOUNTER — Telehealth: Payer: Self-pay

## 2020-12-13 NOTE — Telephone Encounter (Signed)
Yes that could contribute to her symptoms.  Schedule OV

## 2020-12-13 NOTE — Telephone Encounter (Signed)
NA, NVM 

## 2020-12-13 NOTE — Telephone Encounter (Signed)
Patient saw you 04/13. For fatigue and chills. Labs came back normal. Spoke and asked patient if she has had any tick bites states she has pulled off 2 ticks in the past 4 weeks and one was there for a while and had gotten big and left a huge not. Do you think this could cause any symptoms? If so can she have lab work for that? Please advise

## 2020-12-20 NOTE — Telephone Encounter (Signed)
Spoke with patient, appointment scheduled with Nancy Delacruz on 01/21/21 at 10:30 am.

## 2020-12-22 ENCOUNTER — Ambulatory Visit (INDEPENDENT_AMBULATORY_CARE_PROVIDER_SITE_OTHER): Payer: Medicaid Other | Admitting: Nurse Practitioner

## 2020-12-22 ENCOUNTER — Encounter: Payer: Self-pay | Admitting: Nurse Practitioner

## 2020-12-22 ENCOUNTER — Other Ambulatory Visit: Payer: Self-pay

## 2020-12-22 VITALS — BP 134/89 | HR 77 | Temp 98.0°F | Ht 65.0 in | Wt 164.0 lb

## 2020-12-22 DIAGNOSIS — S00461D Insect bite (nonvenomous) of right ear, subsequent encounter: Secondary | ICD-10-CM | POA: Diagnosis not present

## 2020-12-22 DIAGNOSIS — R400 Somnolence: Secondary | ICD-10-CM | POA: Diagnosis not present

## 2020-12-22 DIAGNOSIS — W57XXXD Bitten or stung by nonvenomous insect and other nonvenomous arthropods, subsequent encounter: Secondary | ICD-10-CM | POA: Diagnosis not present

## 2020-12-22 DIAGNOSIS — G47419 Narcolepsy without cataplexy: Secondary | ICD-10-CM | POA: Insufficient documentation

## 2020-12-22 DIAGNOSIS — R5383 Other fatigue: Secondary | ICD-10-CM | POA: Diagnosis not present

## 2020-12-22 DIAGNOSIS — S00461A Insect bite (nonvenomous) of right ear, initial encounter: Secondary | ICD-10-CM | POA: Insufficient documentation

## 2020-12-22 DIAGNOSIS — W57XXXA Bitten or stung by nonvenomous insect and other nonvenomous arthropods, initial encounter: Secondary | ICD-10-CM | POA: Insufficient documentation

## 2020-12-22 NOTE — Assessment & Plan Note (Signed)
Patient's fatigue symptoms unchanged.  In the past B12 and CBC to rule out anemia completed in all test results are normal.  Patient reporting tick bite 4 weeks ago.  Completed Lyme disease testing results pending.

## 2020-12-22 NOTE — Assessment & Plan Note (Signed)
Lyme AB/Western blot reflex completed results pending.

## 2020-12-22 NOTE — Progress Notes (Signed)
Acute Office Visit  Subjective:    Patient ID: Nancy Delacruz [facility], female    DOB: 1961/05/31, 60 y.o.   MRN: [id number]  Chief Complaint  Patient presents with  . Fatigue    HPI Patient is in today for: Fatigue  She reports recurrent fatigue which she describes as a lack of energy, feeling exhausted and feeling sleepy. It began a few weeks ago and occurs all the time. It is described as moderate and worsening. She has not started new medications around the time the fatigue started.   Associated symptoms: No arthralgias No bleeding  No melena No chest discomfort  No heart palpitations No heart racing   No dyspnea No feeling depressed  No feeling anxious or under stress No fevers  No loss of appetite No nausea  No vomiting Yes sleeping problems    Concerning patient's somnolence.  Patient is reporting worsening symptoms as she sleeps without control all day and it is becoming worrisome due to the risk of sleeping off while driving.  Patient reports being stung  By a tick 4 weeks ago and may be having symptoms due to that effect.  Patient has completed labs in the past B12 and CBC  for similar symptoms with all labs coming back as normal.  Patient is concerned and would like to know the reason for her worsening symptoms.  Wt Readings from Last 3 Encounters:  12/22/20 164 lb (74.4 kg)  12/08/20 158 lb (71.7 kg)  11/10/20 159 lb 8 oz (72.3 kg)    Lab Results  Component Value Date   WBC 7.4 12/08/2020   HGB 12.9 12/08/2020   HCT 38.5 12/08/2020   MCV 90 12/08/2020   PLT 324 12/08/2020   Lab Results  Component Value Date   TSH 0.607 12/08/2020   Lab Results  Component Value Date   NA 144 10/20/2020   K 4.1 10/20/2020   CO2 21 10/20/2020   BUN 8 10/20/2020   CREATININE 0.86 10/20/2020   CALCIUM 8.9 10/20/2020   GLUCOSE 92 10/20/2020     ---------------------------------------------------------------------------------------------------    Past Medical History:   Diagnosis Date  . Allergy   . Anemia   . Anxiety   . Arthritis   . Bipolar 1 disorder ([employer])   . Clotting disorder ([place])    had a blood clot near liver, one time  . COPD (chronic obstructive pulmonary disease) ([place])   . Depression   . GERD (gastroesophageal reflux disease)   . History of kidney stones     Past Surgical History:  Procedure Laterality Date  . BIOPSY  09/12/2016   Procedure: BIOPSY;  Surgeon: Danie Binder, MD;  [facility];  Service: Endoscopy;;  random colon bx's, duodenal bx's, gastric bx's  . CHOLECYSTECTOMY  2005  . COLONOSCOPY WITH PROPOFOL N/A 09/12/2016   Dr. Oneida Alar: Random colon biopsies negative, external hemorrhoids, two 3-5 mm polyps in the transverse colon removed were tubular adenomas. Next colonoscopy in 5 years  . ESOPHAGOGASTRODUODENOSCOPY (EGD) WITH PROPOFOL N/A 09/12/2016   Dr. Oneida Alar: Benign-appearing peptic stricture status post dilation, mild erosive gastritis with benign biopsies. Small bowel biopsies negative for celiac.  . HAND SURGERY     left, tendon  . POLYPECTOMY  09/12/2016   Procedure: POLYPECTOMY;  Surgeon: Danie Binder, MD;  [facility];  Service: Endoscopy;;  transverse colon polyps x2  . SAVORY DILATION N/A 09/12/2016   Procedure: SAVORY DILATION;  Surgeon: Danie Binder, MD;  [facility];  Service: Endoscopy;  Laterality: N/A;    Family History  Problem Relation Age of Onset  . Diabetes Mother   . Heart disease Mother 6  . Hypertension Mother   . Diabetes Father   . Alcohol abuse Father   . Heart disease Father 53  . Hyperlipidemia Father   . Hypertension Father   . Cancer Brother        ?lung  . Cancer Brother        BONES  . Colon cancer Neg Hx     Social History   Socioeconomic History  . Marital status: Divorced    Spouse name: Not on file  . Number of children: 0  . Years of education: 7  . Highest education level: Not on file  Occupational History  . Occupation:  unemployed    Comment: "back, copd , nerves"  Tobacco Use  . Smoking status: Current Some Day Smoker    Packs/day: 0.25    Types: Cigarettes    Start date: 2008  . Smokeless tobacco: Never Used  Vaping Use  . Vaping Use: Never used  Substance and Sexual Activity  . Alcohol use: No  . Drug use: No  . Sexual activity: Not Currently    Birth control/protection: Post-menopausal  Other Topics Concern  . Not on file  Social History Narrative   Homeless   Lives with cousin   Is able to drive   Trying to get disability   Social Determinants of Health   Financial Resource Strain: Low Risk   . Difficulty of Paying Living Expenses: Not very hard  Food Insecurity: No Food Insecurity  . Worried About Charity fundraiser in the Last Year: Never true  . Ran Out of Food in the Last Year: Never true  Transportation Needs: No Transportation Needs  . Lack of Transportation (Medical): No  . Lack of Transportation (Non-Medical): No  Physical Activity: Inactive  . Days of Exercise per Week: 0 days  . Minutes of Exercise per Session: 100 min  Stress: No Stress Concern Present  . Feeling of Stress : Not at all  Social Connections: Socially Isolated  . Frequency of Communication with Friends and Family: More than three times a week  . Frequency of Social Gatherings with Friends and Family: More than three times a week  . Attends Religious Services: Never  . Active Member of Clubs or Organizations: No  . Attends Archivist Meetings: Never  . Marital Status: Divorced  Human resources officer Violence: Not At Risk  . Fear of Current or Ex-Partner: No  . Emotionally Abused: No  . Physically Abused: No  . Sexually Abused: No    Outpatient Medications Prior to Visit  Medication Sig Dispense Refill  . busPIRone (BUSPAR) 10 MG tablet Take 10 mg by mouth 3 (three) times daily.    . butalbital-acetaminophen-caffeine (FIORICET) 50-325-40 MG tablet Take 1 tablet by mouth 2 (two) times daily as  needed.    . gabapentin (NEURONTIN) 100 MG capsule Take 1 capsule (100 mg total) by mouth 3 (three) times daily. 90 capsule 0  . HYDROcodone-acetaminophen (NORCO) 10-325 MG tablet 1 tablet every 6 (six) hours as needed.  0  . lisdexamfetamine (VYVANSE) 70 MG capsule Take 70 mg by mouth daily.    Marland Kitchen omeprazole (PRILOSEC) 20 MG capsule Take 1 capsule (20 mg total) by mouth daily. 30 capsule 3  . amphetamine-dextroamphetamine (ADDERALL) 5 MG tablet Take 5 mg by mouth daily. (Patient  not taking: Reported on 12/22/2020)     No facility-administered medications prior to visit.    Allergies  Allergen Reactions  . Sulfamethoxazole-Trimethoprim Other (See Comments)    RASH AROUND MOUTH RASH AROUND MOUTH   . Keflex [Cephalexin] Rash    Review of Systems  Constitutional: Negative.   HENT: Negative.   Eyes: Negative.   Respiratory: Negative.   Cardiovascular: Negative.   Skin: Negative for rash.  Neurological: Negative for syncope and speech difficulty.  Psychiatric/Behavioral: Negative.   All other systems reviewed and are negative.      Objective:    Physical Exam Vitals reviewed.  Constitutional:      Appearance: Normal appearance.  HENT:     Head: Normocephalic.     Nose: Nose normal.  Eyes:     General: No visual field deficit.    Conjunctiva/sclera: Conjunctivae normal.  Cardiovascular:     Rate and Rhythm: Normal rate and regular rhythm.     Pulses: Normal pulses.     Heart sounds: Normal heart sounds.  Pulmonary:     Effort: Pulmonary effort is normal.     Breath sounds: Normal breath sounds.  Abdominal:     General: Bowel sounds are normal.  Skin:    Findings: No rash.  Neurological:     Mental Status: She is alert and oriented to person, place, and time.     Cranial Nerves: No facial asymmetry.     Sensory: Sensation is intact.     Motor: No weakness.     Gait: Gait normal.  Psychiatric:        Behavior: Behavior normal.     BP 134/89   Pulse 77   Temp  98 F (36.7 C) (Temporal)   Ht 5\' 5"  (1.651 m)   Wt 164 lb (74.4 kg)   SpO2 94%   BMI 27.29 kg/m  Wt Readings from Last 3 Encounters:  12/22/20 164 lb (74.4 kg)  12/08/20 158 lb (71.7 kg)  11/10/20 159 lb 8 oz (72.3 kg)    Health Maintenance Due  Topic Date Due  . MAMMOGRAM  03/09/2019    There are no preventive care reminders to display for this patient.   Lab Results  Component Value Date   TSH 0.607 12/08/2020   Lab Results  Component Value Date   WBC 7.4 12/08/2020   HGB 12.9 12/08/2020   HCT 38.5 12/08/2020   MCV 90 12/08/2020   PLT 324 12/08/2020   Lab Results  Component Value Date   NA 144 10/20/2020   K 4.1 10/20/2020   CO2 21 10/20/2020   GLUCOSE 92 10/20/2020   BUN 8 10/20/2020   CREATININE 0.86 10/20/2020   BILITOT <0.2 10/20/2020   ALKPHOS 89 10/20/2020   AST 25 10/20/2020   ALT 19 10/20/2020   PROT 7.0 10/20/2020   ALBUMIN 4.5 10/20/2020   CALCIUM 8.9 10/20/2020   ANIONGAP 9 12/21/2016   Lab Results  Component Value Date   CHOL 264 (H) 10/20/2020   Lab Results  Component Value Date   HDL 51 10/20/2020   Lab Results  Component Value Date   LDLCALC 169 (H) 10/20/2020   Lab Results  Component Value Date   TRIG 234 (H) 10/20/2020   Lab Results  Component Value Date   CHOLHDL 5.2 (H) 10/20/2020   No results found for: HGBA1C     Assessment & Plan:   Problem List Items Addressed This Visit      Nervous and Auditory  Tick bite of right ear    Lyme AB/Western blot reflex completed results pending.      Relevant Orders   Lyme Ab/Western Blot Reflex     Other   Other fatigue - Primary    Patient's fatigue symptoms unchanged.  In the past B12 and CBC to rule out anemia completed in all test results are normal.  Patient reporting tick bite 4 weeks ago.  Completed Lyme disease testing results pending.        Uncontrolled daytime somnolence    Symptoms of uncontrolled daytime somnolence is not well controlled.  Plan is to  refer patient to a sleep study to complete a polysomnogram and multiple sleep latency test to rule out narcolepsy. Referral completed.  We will follow-up with patient pending test results.          No orders of the defined types were placed in this encounter.    Ivy Lynn, NP

## 2020-12-22 NOTE — Assessment & Plan Note (Signed)
Symptoms of uncontrolled daytime somnolence is not well controlled.  Plan is to refer patient to a sleep study to complete a polysomnogram and multiple sleep latency test to rule out narcolepsy. Referral completed.  We will follow-up with patient pending test results.

## 2020-12-22 NOTE — Patient Instructions (Signed)
Fatigue If you have fatigue, you feel tired all the time and have a lack of energy or a lack of motivation. Fatigue may make it difficult to start or complete tasks because of exhaustion. In general, occasional or mild fatigue is often a normal response to activity or life. However, long-lasting (chronic) or extreme fatigue may be a symptom of a medical condition. Follow these instructions at home: General instructions  Watch your fatigue for any changes.  Go to bed and get up at the same time every day.  Avoid fatigue by pacing yourself during the day and getting enough sleep at night.  Maintain a healthy weight. Medicines  Take over-the-counter and prescription medicines only as told by your health care provider.  Take a multivitamin, if told by your health care provider.  Do not use herbal or dietary supplements unless they are approved by your health care provider. Activity  Exercise regularly, as told by your health care provider.  Use or practice techniques to help you relax, such as yoga, tai chi, meditation, or massage therapy.   Eating and drinking  Avoid heavy meals in the evening.  Eat a well-balanced diet, which includes lean proteins, whole grains, plenty of fruits and vegetables, and low-fat dairy products.  Avoid consuming too much caffeine.  Avoid the use of alcohol.  Drink enough fluid to keep your urine pale yellow.   Lifestyle  Change situations that cause you stress. Try to keep your work and personal schedule in balance.  Do not use any products that contain nicotine or tobacco, such as cigarettes and e-cigarettes. If you need help quitting, ask your health care provider.  Do not use drugs. Contact a health care provider if:  Your fatigue does not get better.  You have a fever.  You suddenly lose or gain weight.  You have headaches.  You have trouble falling asleep or sleeping through the night.  You feel angry, guilty, anxious, or  sad.  You are unable to have a bowel movement (constipation).  Your skin is dry.  You have swelling in your legs or another part of your body. Get help right away if:  You feel confused.  Your vision is blurry.  You feel faint or you pass out.  You have a severe headache.  You have severe pain in your abdomen, your back, or the area between your waist and hips (pelvis).  You have chest pain, shortness of breath, or an irregular or fast heartbeat.  You are unable to urinate, or you urinate less than normal.  You have abnormal bleeding, such as bleeding from the rectum, vagina, nose, lungs, or nipples.  You vomit blood.  You have thoughts about hurting yourself or others. If you ever feel like you may hurt yourself or others, or have thoughts about taking your own life, get help right away. You can go to your nearest emergency department or call:  Your local emergency services (911 in the [place]  A suicide crisis helpline, such as the National Suicide Prevention Lifeline at 1-800-273-8255. This is open 24 hours a day. Summary  If you have fatigue, you feel tired all the time and have a lack of energy or a lack of motivation.  Fatigue may make it difficult to start or complete tasks because of exhaustion.  Long-lasting (chronic) or extreme fatigue may be a symptom of a medical condition.  Exercise regularly, as told by your health care provider.  Change situations that cause you stress. Try to   keep your work and personal schedule in balance. This information is not intended to replace advice given to you by your health care provider. Make sure you discuss any questions you have with your health care provider. Document Revised: 03/05/2019 Document Reviewed: 05/09/2017 Elsevier Patient Education  2021 Elsevier Inc. Narcolepsy Narcolepsy is a neurological disorder that causes people to fall asleep suddenly and without control (have sleep attacks) during the daytime. It is a  lifelong disorder. Narcolepsy disrupts the sleep cycle at night, which then causes daytime sleepiness. What are the causes? The cause of narcolepsy is not fully understood, but it may be related to:  Low levels of hypocretin, a chemical (neurotransmitter) in the brain that controls sleep and wake cycles. Hypocretin imbalance may be caused by: ? Abnormal genes that are passed from parent to child (inherited). ? An autoimmune disease in which the body's defense system (immune system) attacks the brain cells that make hypocretin.  Infection, tumor, or injury in the area of the brain that controls sleep.  Exposure to poisons (toxins), such as heavy metals, pesticides, and secondhand smoke. What are the signs or symptoms? Symptoms of this condition include:  Excessive daytime sleepiness. This is the most common symptom and is usually the first symptom you will notice. This may affect your performance at work or school.  Sleep attacks. You may fall asleep in the middle of an activity, especially low-energy activities like reading or watching TV.  Feeling like you cannot think clearly and trouble focusing or remembering things. You may also feel depressed.  Sudden muscle weakness (cataplexy). When this occurs, your speech may become slurred, or your knees may buckle. Cataplexy is usually triggered by surprise, anger, fear, or laughter.  Losing the ability to speak or move (sleep paralysis). This may occur just as you start to fall asleep or wake up. You will be aware of the paralysis. It usually lasts for just a few seconds or minutes.  Seeing, hearing, tasting, smelling, or feeling things that are not real (hallucinations). Hallucinations may occur with sleep paralysis. They can happen when you are falling asleep, waking up, or dozing.  Trouble staying asleep at night (insomnia) and restless sleep. How is this diagnosed? This condition may be diagnosed based on:  A physical exam to rule out  any other problems that may be causing your symptoms. You may be asked to write down your sleeping patterns for several weeks in a sleep diary. This will help your health care provider make a diagnosis.  Sleep studies that measure how well your REM sleep is regulated. These tests also measure your heart rate, breathing, movement, and brain waves. These tests include: ? An overnight sleep study (polysomnogram). ? A daytime sleep study that is done while you take several naps during the day (multiple sleep latency test, MSLT). This test measures how quickly you fall asleep and how quickly you enter REM sleep.  Removal of spinal fluid to measure hypocretin levels. How is this treated? There is no cure for this condition, but treatment can help relieve symptoms. Treatment may include:  Lifestyle and sleeping strategies to help you cope with the condition, such as: ? Exercising regularly. ? Maintaining a regular sleep schedule. ? Avoiding caffeine and large meals before bed.  Medicines. These may include: ? Medicines that help keep you awake and alert (stimulants) to fight daytime sleepiness. ? Medicines that treat depression (antidepressants). These may be used to treat cataplexy. ? Sodium oxybate. This is a strong medicine to   help you relax (sedative) that you may take at night. It can help control daytime sleepiness and cataplexy.  Other treatments may include mental health counseling or joining a support group. Follow these instructions at home: Sleeping habits  Get about 8 hours of sleep every night.  Go to sleep and get up at about the same time every day.  Keep your bedroom dark, quiet, and comfortable.  When you feel very tired, take short naps. Schedule naps so that you take them at about the same time every day.  Before bedtime: ? Avoid bright lights and screens. ? Relax. Try activities like reading or taking a warm bath.   Activity  Get at least 20 minutes of exercise every  day. This will help you sleep better at night and reduce daytime sleepiness.  Avoid exercising within 3 hours of bedtime.  Do not drive or use heavy machinery if you are sleepy. If possible, take a nap before driving.  Do not swim or go out on the water without a life jacket. Eating and drinking  Do not drink alcohol or caffeinated beverages within 4-5 hours of bedtime.  Do not eat a large meal before bedtime.  Eat meals at about the same times every day. General instructions  Take over-the-counter and prescription medicines only as told by your health care provider.  Keep a sleep diary as told by your health care provider.  Tell your employer or teachers that you have narcolepsy. You may be able to adjust your schedule to include time for naps.  Do not use any products that contain nicotine or tobacco, such as cigarettes, e-cigarettes, and chewing tobacco. If you need help quitting, ask your health care provider.  Keep all follow-up visits as told by your health care provider. This is important.   Where to find more information  [employer] of Neurological Disorders: MasterBox[employer].it Contact a health care provider if:  Your symptoms are not getting better.  You have increasingly high blood pressure (hypertension).  You have changes in your heart rhythm.  You are having a hard time determining what is real and what is not (psychosis). Get help right away if you:  Hurt yourself during a sleep attack or an attack of cataplexy.  Have chest pain.  Have trouble breathing. These symptoms may represent a serious problem that is an emergency. Do not wait to see if the symptoms will go away. Get medical help right away. Call your local emergency services (911 in the [place] Do not drive yourself to the hospital. Summary  Narcolepsy is a neurological disorder that causes people to fall asleep suddenly, and without control, during the daytime (sleep attacks). It is a  lifelong disorder.  There is no cure for this condition, but treatment can help relieve symptoms.  Go to sleep and get up at about the same time every day. Follow instructions about sleep and activities as told by your health care provider.  Take over-the-counter and prescription medicines only as told by your health care provider. This information is not intended to replace advice given to you by your health care provider. Make sure you discuss any questions you have with your health care provider. Document Revised: 03/26/2019 Document Reviewed: 03/26/2019 Elsevier Patient Education  [place].

## 2020-12-23 LAB — LYME DISEASE SEROLOGY W/REFLEX: Lyme Total Antibody EIA: NEGATIVE

## 2020-12-27 ENCOUNTER — Ambulatory Visit (INDEPENDENT_AMBULATORY_CARE_PROVIDER_SITE_OTHER): Payer: Medicaid Other | Admitting: Family Medicine

## 2020-12-27 ENCOUNTER — Encounter: Payer: Self-pay | Admitting: Family Medicine

## 2020-12-27 ENCOUNTER — Telehealth: Payer: Self-pay

## 2020-12-27 DIAGNOSIS — B370 Candidal stomatitis: Secondary | ICD-10-CM | POA: Diagnosis not present

## 2020-12-27 MED ORDER — NYSTATIN 100000 UNIT/ML MT SUSP: 5.0000 mL | Freq: Four times a day (QID) | OROMUCOSAL | 0 refills | Status: DC

## 2020-12-27 NOTE — Telephone Encounter (Signed)
NA, NVM 

## 2020-12-27 NOTE — Progress Notes (Signed)
Virtual Visit via telephone Note  I connected with Nancy Delacruz on 12/27/20 at 1326 by telephone and verified that I am speaking with the correct person using two identifiers. Nancy Delacruz [facility] is currently located at home and patient are currently with her during visit. The provider, Fransisca Kaufmann Mazen Marcin, MD is located in their office at time of visit.  Call ended at 1332  I discussed the limitations, risks, security and privacy concerns of performing an evaluation and management service by telephone and the availability of in person appointments. I also discussed with the patient that there may be a patient responsible charge related to this service. The patient expressed understanding and agreed to proceed.   History and Present Illness: Patient is having white in mouth and thrush and burning.  It started yesterday. She has had this before once. And was told that she had thrush. She is using biotene  1. Thrush, oral     Outpatient Encounter Medications as of 12/27/2020  Medication Sig  . nystatin (MYCOSTATIN) 100000 UNIT/ML suspension Take 5 mLs (500,000 Units total) by mouth 4 (four) times daily.  Marland Kitchen amphetamine-dextroamphetamine (ADDERALL) 5 MG tablet Take 5 mg by mouth daily. (Patient not taking: Reported on 12/22/2020)  . busPIRone (BUSPAR) 10 MG tablet Take 10 mg by mouth 3 (three) times daily.  . butalbital-acetaminophen-caffeine (FIORICET) 50-325-40 MG tablet Take 1 tablet by mouth 2 (two) times daily as needed.  . gabapentin (NEURONTIN) 100 MG capsule Take 1 capsule (100 mg total) by mouth 3 (three) times daily.  Marland Kitchen HYDROcodone-acetaminophen (NORCO) 10-325 MG tablet 1 tablet every 6 (six) hours as needed.  Marland Kitchen lisdexamfetamine (VYVANSE) 70 MG capsule Take 70 mg by mouth daily.  Marland Kitchen omeprazole (PRILOSEC) 20 MG capsule Take 1 capsule (20 mg total) by mouth daily.   No facility-administered encounter medications on file as of 12/27/2020.    Review of Systems  Constitutional: Negative for  chills and fever.  HENT: Positive for mouth sores and sore throat. Negative for congestion, ear discharge, ear pain, sinus pressure and sneezing.   Eyes: Negative for redness and visual disturbance.  Respiratory: Negative for cough, chest tightness and shortness of breath.   Cardiovascular: Negative for chest pain and leg swelling.  Genitourinary: Negative for difficulty urinating and dysuria.  Musculoskeletal: Negative for back pain and gait problem.  Skin: Negative for rash.  Neurological: Negative for light-headedness and headaches.  Psychiatric/Behavioral: Negative for agitation and behavioral problems.  All other systems reviewed and are negative.   Observations/Objective: Patient sounds comfortable and in no acute distress  Assessment and Plan: Problem List Items Addressed This Visit   None   Visit Diagnoses    Thrush, oral    -  Primary   Relevant Medications   nystatin (MYCOSTATIN) 100000 UNIT/ML suspension      Sounds like thrush based on what she is describing although I do not have her on any inhalers or anything that would cause it, I warned her that if it does not get better quickly with this medication and she will have to come in to be seen so we can look at them. Follow up plan: Return if symptoms worsen or fail to improve.     I discussed the assessment and treatment plan with the patient. The patient was provided an opportunity to ask questions and all were answered. The patient agreed with the plan and demonstrated an understanding of the instructions.   The patient was advised to call back or seek  an in-person evaluation if the symptoms worsen or if the condition fails to improve as anticipated.  The above assessment and management plan was discussed with the patient. The patient verbalized understanding of and has agreed to the management plan. Patient is aware to call the clinic if symptoms persist or worsen. Patient is aware when to return to the clinic for  a follow-up visit. Patient educated on when it is appropriate to go to the emergency department.    I provided 6 minutes of non-face-to-face time during this encounter.    Worthy Rancher, MD

## 2021-01-07 NOTE — Telephone Encounter (Signed)
Old message, pt already aware

## 2021-01-13 ENCOUNTER — Ambulatory Visit: Payer: Medicaid Other | Admitting: Adult Health

## 2021-01-13 ENCOUNTER — Other Ambulatory Visit: Payer: Medicaid Other

## 2021-01-14 ENCOUNTER — Ambulatory Visit: Payer: Medicaid Other | Admitting: Nurse Practitioner

## 2021-01-14 ENCOUNTER — Encounter: Payer: Self-pay | Admitting: Nurse Practitioner

## 2021-01-19 ENCOUNTER — Ambulatory Visit: Payer: Medicaid Other | Admitting: Physical Therapy

## 2021-02-02 ENCOUNTER — Ambulatory Visit: Payer: Medicaid Other | Attending: Neurology

## 2021-02-02 ENCOUNTER — Other Ambulatory Visit: Payer: Self-pay

## 2021-02-02 DIAGNOSIS — M545 Low back pain, unspecified: Secondary | ICD-10-CM | POA: Diagnosis present

## 2021-02-02 DIAGNOSIS — M6281 Muscle weakness (generalized): Secondary | ICD-10-CM | POA: Diagnosis present

## 2021-02-02 DIAGNOSIS — G8929 Other chronic pain: Secondary | ICD-10-CM | POA: Insufficient documentation
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
## 2021-02-07 NOTE — Addendum Note (Signed)
Addended by: Marylou Mccoy on: 02/07/2021 11:44 AM   Modules accepted: Orders

## 2021-02-08 ENCOUNTER — Other Ambulatory Visit (HOSPITAL_COMMUNITY): Payer: Self-pay | Admitting: Nurse Practitioner

## 2021-02-08 DIAGNOSIS — Z1231 Encounter for screening mammogram for malignant neoplasm of breast: Secondary | ICD-10-CM

## 2021-02-09 ENCOUNTER — Encounter: Payer: Self-pay | Admitting: Adult Health

## 2021-02-09 ENCOUNTER — Ambulatory Visit (INDEPENDENT_AMBULATORY_CARE_PROVIDER_SITE_OTHER): Payer: Medicaid Other | Admitting: Adult Health

## 2021-02-09 ENCOUNTER — Ambulatory Visit (INDEPENDENT_AMBULATORY_CARE_PROVIDER_SITE_OTHER): Payer: Medicaid Other

## 2021-02-09 ENCOUNTER — Other Ambulatory Visit: Payer: Self-pay

## 2021-02-09 VITALS — BP 162/92 | HR 91 | Ht 65.5 in | Wt 160.5 lb

## 2021-02-09 DIAGNOSIS — R140 Abdominal distension (gaseous): Secondary | ICD-10-CM | POA: Insufficient documentation

## 2021-02-09 DIAGNOSIS — N950 Postmenopausal bleeding: Secondary | ICD-10-CM | POA: Diagnosis not present

## 2021-02-09 DIAGNOSIS — N952 Postmenopausal atrophic vaginitis: Secondary | ICD-10-CM | POA: Diagnosis not present

## 2021-02-09 MED ORDER — PREMARIN 0.625 MG/GM VA CREA: TOPICAL_CREAM | VAGINAL | 1 refills | Status: DC

## 2021-02-09 NOTE — Progress Notes (Signed)
PELVIC US TA/TV: atrophic homogeneous anteflexed uterus,wnl,EEC 1.7 mm,normal ovaries,limited view of ovaries,no free fluid,no pain during ultrasound   Chaperone Peggy

## 2021-02-09 NOTE — Progress Notes (Signed)
  Subjective:     Patient ID: Nancy Delacruz, female   DOB: 25-Sep-1960, 60 y.o.   MRN: [id number]  HPI Nancy Delacruz is a 60 year old white female, divorced, PM in for GYN [place] for PMB, no bleeding since February. Still bloating.  Review of Systems No bleeding now +bloating Reviewed past medical,surgical, social and family history. Reviewed medications and allergies.     Objective:   Physical Exam BP (!) 162/92 (BP Location: Left Arm, Patient Position: Sitting, Cuff Size: Normal)   Pulse 91   Ht 5' 5.5" (1.664 m)   Wt 160 lb 8 oz (72.8 kg)   BMI 26.30 kg/m     Skin warm and dry.Lungs: clear to ausculation bilaterally. Cardiovascular: regular rate and rhythm.  US showed normal uterus and EEC 1.7 mm, ovaries appear normal on limited view. Fall risk is low  Up[facility] - 02/09/21 1205       Pregnancy Intention Screening   Does the patient want to become pregnant in the next year? N/A    Does the patient's partner want to become pregnant in the next year? N/A    Would the patient like to discuss contraceptive options today? N/A      Contraception Wrap Up   Current Method No Method - Other Reason   postmenopausal   End Method No Method - Other Reason   postmenopausal   Contraception Counseling Provided No             Assessment:     1. Vaginal atrophy Will rx premarin vaginal cream  Meds ordered this encounter  Medications   conjugated estrogens (PREMARIN) vaginal cream    Sig: Use 0.5 gm in vagina at bedtime, every day for 2 weeks then 2-3 x a week    Dispense:  42.5 g    Refill:  1    Order Specific Question:   Supervising Provider    Answer:   Elonda Husky, LUTHER H [[id number]]     2. Bloating Call GI for appt  3. PMB (postmenopausal bleeding) No further follow up needed     Plan:     Physical and pap with PCP Follow up prn

## 2021-03-02 ENCOUNTER — Encounter: Payer: Self-pay | Admitting: Neurology

## 2021-03-02 ENCOUNTER — Institutional Professional Consult (permissible substitution): Payer: Medicaid Other | Admitting: Neurology

## 2021-03-18 ENCOUNTER — Ambulatory Visit (INDEPENDENT_AMBULATORY_CARE_PROVIDER_SITE_OTHER): Payer: Medicaid Other | Admitting: Family Medicine

## 2021-03-18 DIAGNOSIS — L02411 Cutaneous abscess of right axilla: Secondary | ICD-10-CM

## 2021-03-18 MED ORDER — DOXYCYCLINE HYCLATE 100 MG PO TABS: 100.0000 mg | ORAL_TABLET | Freq: Two times a day (BID) | ORAL | 0 refills | Status: AC

## 2021-03-18 NOTE — Progress Notes (Signed)
Virtual Visit via Telephone Note  I connected with Fajardo on 03/18/21 at 5:25 PM by telephone and verified that I am speaking with the correct person using two identifiers. Jamilya Manalili [facility] is currently located at home and nobody is currently with her during this visit. The provider, Loman Brooklyn, FNP is located in their office at time of visit.  I discussed the limitations, risks, security and privacy concerns of performing an evaluation and management service by telephone and the availability of in person appointments. I also discussed with the patient that there may be a patient responsible charge related to this service. The patient expressed understanding and agreed to proceed.  Subjective: PCP: Ivy Lynn, NP  Chief Complaint  Patient presents with   Recurrent Skin Infections   Patient reports a boil in her right axilla the size of a peanut.  It is nontender at this time with no swelling or drainage.  It is red and warm.  She states she gets these frequently and if she does not get them taking care of of she has to end up going to the urgent care so that they can lance them.   ROS: Per HPI  Current Outpatient Medications:    amphetamine-dextroamphetamine (ADDERALL) 5 MG tablet, Take 5 mg by mouth daily., Disp: , Rfl:    busPIRone (BUSPAR) 10 MG tablet, Take 10 mg by mouth 3 (three) times daily., Disp: , Rfl:    conjugated estrogens (PREMARIN) vaginal cream, Use 0.5 gm in vagina at bedtime, every day for 2 weeks then 2-3 x a week, Disp: 42.5 g, Rfl: 1   gabapentin (NEURONTIN) 100 MG capsule, Take 1 capsule (100 mg total) by mouth 3 (three) times daily., Disp: 90 capsule, Rfl: 0   HYDROcodone-acetaminophen (NORCO) 10-325 MG tablet, 1 tablet every 6 (six) hours as needed., Disp: , Rfl: 0   lisdexamfetamine (VYVANSE) 70 MG capsule, Take 70 mg by mouth daily., Disp: , Rfl:   Allergies  Allergen Reactions   Sulfamethoxazole-Trimethoprim Other (See Comments)    RASH  AROUND MOUTH RASH AROUND MOUTH    Keflex [Cephalexin] Rash   Past Medical History:  Diagnosis Date   Allergy    Anemia    Anxiety    Arthritis    Bipolar 1 disorder ([place])    Clotting disorder ([facility])    had a blood clot near liver, one time   COPD (chronic obstructive pulmonary disease) (H[facility])    Depression    GERD (gastroesophageal reflux disease)    History of kidney stones     Observations/Objective: A&O  No respiratory distress or wheezing audible over the phone Mood, judgement, and thought processes all WNL   Assessment and Plan: 1. Abscess of right axilla - doxycycline (VIBRA-TABS) 100 MG tablet; Take 1 tablet (100 mg total) by mouth 2 (two) times daily for 7 days.  Dispense: 14 tablet; Refill: 0   Follow Up Instructions:  I discussed the assessment and treatment plan with the patient. The patient was provided an opportunity to ask questions and all were answered. The patient agreed with the plan and demonstrated an understanding of the instructions.   The patient was advised to call back or seek an in-person evaluation if the symptoms worsen or if the condition fails to improve as anticipated.  The above assessment and management plan was discussed with the patient. The patient verbalized understanding of and has agreed to the management plan. Patient is aware to call the clinic if  symptoms persist or worsen. Patient is aware when to return to the clinic for a follow-up visit. Patient educated on when it is appropriate to go to the emergency department.   Time call ended: 5:36 PM  I provided 11 minutes of non-face-to-face time during this encounter.  Hendricks Limes, MSN, APRN, FNP-C [facility] 03/18/21

## 2021-04-04 ENCOUNTER — Other Ambulatory Visit: Payer: Self-pay

## 2021-04-04 ENCOUNTER — Ambulatory Visit
Admission: RE | Admit: 2021-04-04 | Discharge: 2021-04-04 | Disposition: A | Discharge status: Home / Self Care | Payer: Medicaid Other | Source: Ambulatory Visit | Attending: Nurse Practitioner | Admitting: Nurse Practitioner

## 2021-04-04 ENCOUNTER — Encounter: Payer: Self-pay | Admitting: Nurse Practitioner

## 2021-04-04 ENCOUNTER — Ambulatory Visit: Payer: Medicaid Other | Admitting: Nurse Practitioner

## 2021-04-04 DIAGNOSIS — E7800 Pure hypercholesterolemia, unspecified: Secondary | ICD-10-CM

## 2021-04-04 DIAGNOSIS — Z23 Encounter for immunization: Secondary | ICD-10-CM

## 2021-04-04 DIAGNOSIS — Z1231 Encounter for screening mammogram for malignant neoplasm of breast: Secondary | ICD-10-CM

## 2021-04-04 DIAGNOSIS — E785 Hyperlipidemia, unspecified: Secondary | ICD-10-CM | POA: Insufficient documentation

## 2021-04-04 DIAGNOSIS — R208 Other disturbances of skin sensation: Secondary | ICD-10-CM | POA: Diagnosis not present

## 2021-04-04 DIAGNOSIS — K2100 Gastro-esophageal reflux disease with esophagitis, without bleeding: Secondary | ICD-10-CM

## 2021-04-04 DIAGNOSIS — F411 Generalized anxiety disorder: Secondary | ICD-10-CM

## 2021-04-04 HISTORY — DX: Pure hypercholesterolemia, unspecified: E78.00

## 2021-04-04 MED ORDER — GABAPENTIN 100 MG PO CAPS: 100.0000 mg | ORAL_CAPSULE | Freq: Three times a day (TID) | ORAL | 2 refills | Status: DC

## 2021-04-04 NOTE — Patient Instructions (Signed)
http://NIMH.NIH.Gov">  Generalized Anxiety Disorder, Adult Generalized anxiety disorder (GAD) is a mental health condition. Unlike normal worries, anxiety related to GAD is not triggered by a specific event. These worries do not fade or get better with time. GAD interferes with relationships,work, and school. GAD symptoms can vary from mild to severe. People with severe GAD can have intense waves of anxiety with physical symptoms that are similar to panicattacks. What are the causes? The exact cause of GAD is not known, but the following are believed to have an impact: Differences in natural brain chemicals. Genes passed down from parents to children. Differences in the way threats are perceived. Development during childhood. Personality. What increases the risk? The following factors may make you more likely to develop this condition: Being female. Having a family history of anxiety disorders. Being very shy. Experiencing very stressful life events, such as the death of a loved one. Having a very stressful family environment. What are the signs or symptoms? People with GAD often worry excessively about many things in their lives, such as their health and family. Symptoms may also include: Mental and emotional symptoms: Worrying excessively about natural disasters. Fear of being late. Difficulty concentrating. Fears that others are judging your performance. Physical symptoms: Fatigue. Headaches, muscle tension, muscle twitches, trembling, or feeling shaky. Feeling like your heart is pounding or beating very fast. Feeling out of breath or like you cannot take a deep breath. Having trouble falling asleep or staying asleep, or experiencing restlessness. Sweating. Nausea, diarrhea, or irritable bowel syndrome (IBS). Behavioral symptoms: Experiencing erratic moods or irritability. Avoidance of new situations. Avoidance of people. Extreme difficulty making decisions. How is this  diagnosed? This condition is diagnosed based on your symptoms and medical history. You will also have a physical exam. Your health care provider may perform tests torule out other possible causes of your symptoms. To be diagnosed with GAD, a person must have anxiety that: Is out of his or her control. Affects several different aspects of his or her life, such as work and relationships. Causes distress that makes him or her unable to take part in normal activities. Includes at least three symptoms of GAD, such as restlessness, fatigue, trouble concentrating, irritability, muscle tension, or sleep problems. Before your health care provider can confirm a diagnosis of GAD, these symptoms must be present more days than they are not, and they must last for 6 months orlonger. How is this treated? This condition may be treated with: Medicine. Antidepressant medicine is usually prescribed for long-term daily control. Anti-anxiety medicines may be added in severe cases, especially when panic attacks occur. Talk therapy (psychotherapy). Certain types of talk therapy can be helpful in treating GAD by providing support, education, and guidance. Options include: Cognitive behavioral therapy (CBT). People learn coping skills and self-calming techniques to ease their physical symptoms. They learn to identify unrealistic thoughts and behaviors and to replace them with more appropriate thoughts and behaviors. Acceptance and commitment therapy (ACT). This treatment teaches people how to be mindful as a way to cope with unwanted thoughts and feelings. Biofeedback. This process trains you to manage your body's response (physiological response) through breathing techniques and relaxation methods. You will work with a therapist while machines are used to monitor your physical symptoms. Stress management techniques. These include yoga, meditation, and exercise. A mental health specialist can help determine which treatment  is best for you. Some people see improvement with one type of therapy. However, other peoplerequire a combination of therapies. Follow  these instructions at home: Lifestyle Maintain a consistent routine and schedule. Anticipate stressful situations. Create a plan, and allow extra time to work with your plan. Practice stress management or self-calming techniques that you have learned from your therapist or your health care provider. General instructions Take over-the-counter and prescription medicines only as told by your health care provider. Understand that you are likely to have setbacks. Accept this and be kind to yourself as you persist to take better care of yourself. Recognize and accept your accomplishments, even if you judge them as small. Keep all follow-up visits as told by your health care provider. This is important. Contact a health care provider if: Your symptoms do not get better. Your symptoms get worse. You have signs of depression, such as: A persistently sad or irritable mood. Loss of enjoyment in activities that used to bring you joy. Change in weight or eating. Changes in sleeping habits. Avoiding friends or family members. Loss of energy for normal tasks. Feelings of guilt or worthlessness. Get help right away if: You have serious thoughts about hurting yourself or others. If you ever feel like you may hurt yourself or others, or have thoughts about taking your own life, get help right away. Go to your nearest emergency department or: Call your local emergency services (911 in the [place] Call a suicide crisis helpline, such as the [place] at 856-436-1960. This is open 24 hours a day in the [place] Text the Crisis Text Line at 620-091-1768 (in the [place].). Summary Generalized anxiety disorder (GAD) is a mental health condition that involves worry that is not triggered by a specific event. People with GAD often worry excessively about many things  in their lives, such as their health and family. GAD may cause symptoms such as restlessness, trouble concentrating, sleep problems, frequent sweating, nausea, diarrhea, headaches, and trembling or muscle twitching. A mental health specialist can help determine which treatment is best for you. Some people see improvement with one type of therapy. However, other people require a combination of therapies. This information is not intended to replace advice given to you by your health care provider. Make sure you discuss any questions you have with your healthcare provider. Document Revised: 06/04/2019 Document Reviewed: 06/04/2019 Elsevier Patient Education  [place]. http://APA.org/depression-guideline"> https://clinicalkey.com"> http://point-of-care.elsevierperformancemanager.com/skills/"> http://point-of-care.elsevierperformancemanager.com">  Managing Depression, Adult Depression is a mental health condition that affects your thoughts, feelings, and actions. Being diagnosed with depression can bring you relief if you did not know why you have felt or behaved a certain way. It could also leave you feeling overwhelmed with uncertainty about your future. Preparing yourself tomanage your symptoms can help you feel more positive about your future. How to manage lifestyle changes Managing stress  Stress is your body's reaction to life changes and events, both good and bad. Stress can add to your feelings of depression. Learning to manage your stresscan help lessen your feelings of depression. Try some of the following approaches to reducing your stress (stress reduction techniques): Listen to music that you enjoy and that inspires you. Try using a meditation app or take a meditation class. Develop a practice that helps you connect with your spiritual self. Walk in nature, pray, or go to a place of worship. Do some deep breathing. To do this, inhale slowly through your nose. Pause at the top of  your inhale for a few seconds and then exhale slowly, letting your muscles relax. Practice yoga to help relax and work your muscles. Choose  a stress reduction technique that suits your lifestyle and personality. These techniques take time and practice to develop. Set aside 5-15 minutes a day to do them. Therapists can offer training in these techniques. Other things you can do to manage stress include: Keeping a stress diary. Knowing your limits and saying no when you think something is too much. Paying attention to how you react to certain situations. You may not be able to control everything, but you can change your reaction. Adding humor to your life by watching funny films or TV shows. Making time for activities that you enjoy and that relax you.  Medicines Medicines, such as antidepressants, are often a part of treatment for depression. Talk with your pharmacist or health care provider about all the medicines, supplements, and herbal products that you take, their possible side effects, and what medicines and other products are safe to take together. Make sure to report any side effects you may have to your health care provider. Relationships Your health care provider may suggest family therapy, couples therapy, orindividual therapy as part of your treatment. How to recognize changes Everyone responds differently to treatment for depression. As you recover from depression, you may start to: Have more interest in doing activities. Feel less hopeless. Have more energy. Overeat less often, or have a better appetite. Have better mental focus. It is important to recognize if your depression is not getting better or is getting worse. The symptoms you had in the beginning may return, such as: Tiredness (fatigue) or low energy. Eating too much or too little. Sleeping too much or too little. Feeling restless, agitated, or hopeless. Trouble focusing or making decisions. Unexplained physical  complaints. Feeling irritable, angry, or aggressive. If you or your family members notice these symptoms coming back, let yourhealth care provider know right away. Follow these instructions at home: Activity  Try to get some form of exercise each day, such as walking, biking, swimming, or lifting weights. Practice stress reduction techniques. Engage your mind by taking a class or doing some volunteer work.  Lifestyle Get the right amount and quality of sleep. Cut down on using caffeine, tobacco, alcohol, and other potentially harmful substances. Eat a healthy diet that includes plenty of vegetables, fruits, whole grains, low-fat dairy products, and lean protein. Do not eat a lot of foods that are high in solid fats, added sugars, or salt (sodium). General instructions Take over-the-counter and prescription medicines only as told by your health care provider. Keep all follow-up visits as told by your health care provider. This is important. Where to find support Talking to others  Friends and family members can be sources of support and guidance. Talk to trusted friends or family members about your condition. Explain your symptoms to them, and let them know that you are working with a health care provider to treat your depression. Tell friends and family members how they also can behelpful. Finances Find appropriate mental health providers that fit with your financial situation. Talk with your health care provider about options to get reduced prices on your medicines. Where to find more information You can find support in your area from: Anxiety and Depression Association of America (ADAA): www.adaa.org Mental Health America: www.mentalhealthamerica.net [employer] on Mental Illness: www.nami.org Contact a health care provider if: You stop taking your antidepressant medicines, and you have any of these symptoms: Nausea. Headache. Light-headedness. Chills and body aches. Not  being able to sleep (insomnia). You or your friends and family think your depression  is getting worse. Get help right away if: You have thoughts of hurting yourself or others. If you ever feel like you may hurt yourself or others, or have thoughts about taking your own life, get help right away. Go to your nearest emergency department or: Call your local emergency services (911 in the [place] Call a suicide crisis helpline, such as the [place] at 514-781-8291. This is open 24 hours a day in the [place] Text the Crisis Text Line at (240)727-0679 (in the [place].). Summary If you are diagnosed with depression, preparing yourself to manage your symptoms is a good way to feel positive about your future. Work with your health care provider on a management plan that includes stress reduction techniques, medicines (if applicable), therapy, and healthy lifestyle habits. Keep talking with your health care provider about how your treatment is working. If you have thoughts about taking your own life, call a suicide crisis helpline or text a crisis text line. This information is not intended to replace advice given to you by your health care provider. Make sure you discuss any questions you have with your healthcare provider. Document Revised: 06/25/2019 Document Reviewed: 06/25/2019 Elsevier Patient Education  2022 [employer].

## 2021-04-04 NOTE — Assessment & Plan Note (Signed)
Completed lipid panel, CBC, CMP, results pending.  Encouraged low-cholesterol diet, exercise at least 3 times a week.

## 2021-04-04 NOTE — Assessment & Plan Note (Signed)
Burning sensation of the feet well controlled with gabapentin 100 mg tablet by mouth 3 times daily.  Rx refill sent to pharmacy.

## 2021-04-04 NOTE — Assessment & Plan Note (Signed)
GERD symptoms well controlled, continue healthy diet and exercise as tolerated.

## 2021-04-04 NOTE — Progress Notes (Signed)
Established Patient Office Visit  Subjective:  Patient ID: Nancy Delacruz, female    DOB: 12/27/1960  Age: 60 y.o. MRN: [id number]  CC:  Chief Complaint  Patient presents with   Medical Management of Chronic Issues    HPI Donald presents for Anxiety, Follow-up  She was last seen for anxiety 6 months ago. Changes made at last visit include BuSper 10 mg tablet by mouth.   She reports good compliance with treatment. She reports good tolerance of treatment. She is not having side effects.   She feels her anxiety is mild and Improved since last visit.  Symptoms: No chest pain No difficulty concentrating  No dizziness No fatigue  No feelings of losing control No insomnia  No irritable No palpitations  No panic attacks No racing thoughts  No shortness of breath No sweating  No tremors/shakes    GAD-7 Results GAD-7 Generalized Anxiety Disorder Screening Tool 04/04/2021 11/10/2020 09/17/2020  1. Feeling Nervous, Anxious, or on Edge 0 0 0  2. Not Being Able to Stop or Control Worrying 0 0 0  3. Worrying Too Much About Different Things 0 0 0  4. Trouble Relaxing 0 0 0  5. Being So Restless it's Hard To Sit Still 0 0 0  6. Becoming Easily Annoyed or Irritable 0 0 0  7. Feeling Afraid As If Something Awful Might Happen - 0 0  Total GAD-7 Score - 0 0  Difficulty At Work, Home, or Getting  Along With Others? - - Not difficult at all    PHQ-9 Scores PHQ9 SCORE ONLY 04/04/2021 12/22/2020 12/08/2020  PHQ-9 Total Score 0 0 0  GERD, Follow up:  The patient was last seen for GERD 6 months ago. Changes made since that visit include over the counter tums.  She reports good compliance with treatment. She is not having side effects. .   She is NOT experiencing belching, belching and eructation, bilious reflux, chest pain, choking on food, or cough   Neuropathy: She describes symptoms of burning and tingling. Onset of symptoms was sudden, not related to any specific activity.  Symptoms are currently of moderate severity. Symptoms occur intermittently and last hours. The patient denies numbness and tingling. Symptoms are symmetric. She also describes autonomic symptoms of  bloating. She denies  orthostasis, early satiety, and diarrhea. Previous treatment has included gabapentin, which has improved symptoms.     Past Medical History:  Diagnosis Date   Allergy    Anemia    Anxiety    Arthritis    Bipolar 1 disorder ([place])    Clotting disorder ([place])    had a blood clot near liver, one time   COPD (chronic obstructive pulmonary disease) (HCC)    Depression    Elevated cholesterol 04/04/2021   GERD (gastroesophageal reflux disease)    History of kidney stones     Past Surgical History:  Procedure Laterality Date   BIOPSY  09/12/2016   Procedure: BIOPSY;  Surgeon: Danie Binder, MD;  [facility];  Service: Endoscopy;;  random colon bx's, duodenal bx's, gastric bx's   CHOLECYSTECTOMY  2005   COLONOSCOPY WITH PROPOFOL N/A 09/12/2016   Dr. Oneida Alar: Random colon biopsies negative, external hemorrhoids, two 3-5 mm polyps in the transverse colon removed were tubular adenomas. Next colonoscopy in 5 years   ESOPHAGOGASTRODUODENOSCOPY (EGD) WITH PROPOFOL N/A 09/12/2016   Dr. Oneida Alar: Benign-appearing peptic stricture status post dilation, mild erosive gastritis with benign biopsies. Small bowel biopsies negative for  celiac.   HAND SURGERY     left, tendon   POLYPECTOMY  09/12/2016   Procedure: POLYPECTOMY;  Surgeon: Danie Binder, MD;  [facility];  Service: Endoscopy;;  transverse colon polyps x2   SAVORY DILATION N/A 09/12/2016   Procedure: SAVORY DILATION;  Surgeon: Danie Binder, MD;  [facility];  Service: Endoscopy;  Laterality: N/A;    Family History  Problem Relation Age of Onset   Diabetes Mother    Heart disease Mother 47   Hypertension Mother    Diabetes Father    Alcohol abuse Father    Heart disease Father 67    Hyperlipidemia Father    Hypertension Father    Cancer Brother        ?lung   Cancer Brother        BONES   Colon cancer Neg Hx     Social History   Socioeconomic History   Marital status: Divorced    Spouse name: Not on file   Number of children: 0   Years of education: 12   Highest education level: Not on file  Occupational History   Occupation: unemployed    Comment: "back, copd , nerves"  Tobacco Use   Smoking status: Some Days    Packs/day: 0.25    Types: Cigarettes    Start date: 2008   Smokeless tobacco: Never  Vaping Use   Vaping Use: Never used  Substance and Sexual Activity   Alcohol use: No   Drug use: No   Sexual activity: Not Currently    Birth control/protection: Post-menopausal  Other Topics Concern   Not on file  Social History Narrative   Homeless   Lives with cousin   Is able to drive   Trying to get disability   Social Determinants of Health   Financial Resource Strain: Low Risk    Difficulty of Paying Living Expenses: Not very hard  Food Insecurity: No Food Insecurity   Worried About Charity fundraiser in the Last Year: Never true   [place] in the Last Year: Never true  Transportation Needs: No Transportation Needs   Lack of Transportation (Medical): No   Lack of Transportation (Non-Medical): No  Physical Activity: Inactive   Days of Exercise per Week: 0 days   Minutes of Exercise per Session: 100 min  Stress: No Stress Concern Present   Feeling of Stress : Not at all  Social Connections: Socially Isolated   Frequency of Communication with Friends and Family: More than three times a week   Frequency of Social Gatherings with Friends and Family: More than three times a week   Attends Religious Services: Never   Marine scientist or Organizations: No   Attends Music therapist: Never   Marital Status: Divorced  Human resources officer Violence: Not At Risk   Fear of Current or Ex-Partner: No   Emotionally Abused:  No   Physically Abused: No   Sexually Abused: No    Outpatient Medications Prior to Visit  Medication Sig Dispense Refill   busPIRone (BUSPAR) 10 MG tablet Take 10 mg by mouth 3 (three) times daily.     conjugated estrogens (PREMARIN) vaginal cream Use 0.5 gm in vagina at bedtime, every day for 2 weeks then 2-3 x a week 42.5 g 1   HYDROcodone-acetaminophen (NORCO) 10-325 MG tablet 1 tablet every 6 (six) hours as needed.  0   methylphenidate (RITALIN) 20 MG tablet Take  20 mg by mouth 3 (three) times daily.     gabapentin (NEURONTIN) 100 MG capsule Take 1 capsule (100 mg total) by mouth 3 (three) times daily. 90 capsule 0   amphetamine-dextroamphetamine (ADDERALL) 5 MG tablet Take 5 mg by mouth daily.     lisdexamfetamine (VYVANSE) 70 MG capsule Take 70 mg by mouth daily.     No facility-administered medications prior to visit.    Allergies  Allergen Reactions   Sulfamethoxazole-Trimethoprim Other (See Comments)    RASH AROUND MOUTH RASH AROUND MOUTH    Keflex [Cephalexin] Rash    ROS Review of Systems  Constitutional: Negative.   HENT: Negative.    Eyes: Negative.   Respiratory: Negative.    Gastrointestinal: Negative.   Genitourinary: Negative.   Skin:  Negative for rash.  All other systems reviewed and are negative.    Objective:    Physical Exam Vitals and nursing note reviewed.  Constitutional:      Appearance: Normal appearance.  HENT:     Head: Normocephalic.     Nose: Nose normal.     Mouth/Throat:     Mouth: Mucous membranes are moist.     Pharynx: Oropharynx is clear.  Eyes:     Conjunctiva/sclera: Conjunctivae normal.  Cardiovascular:     Rate and Rhythm: Normal rate and regular rhythm.     Pulses: Normal pulses.     Heart sounds: Normal heart sounds.  Pulmonary:     Effort: Pulmonary effort is normal.     Breath sounds: Normal breath sounds.  Abdominal:     General: Bowel sounds are normal.  Skin:    General: Skin is dry.     Findings: No  rash.  Neurological:     Mental Status: She is alert and oriented to person, place, and time.  Psychiatric:        Attention and Perception: Attention and perception normal.        Mood and Affect: Mood and affect normal.        Speech: Speech normal.        Behavior: Behavior normal.        Thought Content: Thought content normal.    There were no vitals taken for this visit. Wt Readings from Last 3 Encounters:  02/09/21 160 lb 8 oz (72.8 kg)  12/22/20 164 lb (74.4 kg)  12/08/20 158 lb (71.7 kg)     Health Maintenance Due  Topic Date Due   Pneumococcal Vaccine 35-52 Years old (2 - PPSV23 or PCV20) 06/22/2018   MAMMOGRAM  03/09/2019   INFLUENZA VACCINE  03/28/2021    There are no preventive care reminders to display for this patient.  Lab Results  Component Value Date   TSH 0.607 12/08/2020   Lab Results  Component Value Date   WBC 7.4 12/08/2020   HGB 12.9 12/08/2020   HCT 38.5 12/08/2020   MCV 90 12/08/2020   PLT 324 12/08/2020   Lab Results  Component Value Date   NA 144 10/20/2020   K 4.1 10/20/2020   CO2 21 10/20/2020   GLUCOSE 92 10/20/2020   BUN 8 10/20/2020   CREATININE 0.86 10/20/2020   BILITOT <0.2 10/20/2020   ALKPHOS 89 10/20/2020   AST 25 10/20/2020   ALT 19 10/20/2020   PROT 7.0 10/20/2020   ALBUMIN 4.5 10/20/2020   CALCIUM 8.9 10/20/2020   ANIONGAP 9 12/21/2016   Lab Results  Component Value Date   CHOL 264 (H) 10/20/2020   Lab Results  Component Value Date   HDL 51 10/20/2020   Lab Results  Component Value Date   LDLCALC 169 (H) 10/20/2020   Lab Results  Component Value Date   TRIG 234 (H) 10/20/2020   Lab Results  Component Value Date   CHOLHDL 5.2 (H) 10/20/2020   No results found for: HGBA1C    Assessment & Plan:   Problem List Items Addressed This Visit       Digestive   Gastroesophageal reflux disease with esophagitis    GERD symptoms well controlled, continue healthy diet and exercise as tolerated.        Relevant Orders   Comprehensive metabolic panel   CBC with Differential     Other   Generalized anxiety disorder    Generalized anxiety well controlled on BuSpar 10 mg tablet by mouth 3 times daily..       Burning sensation of feet    Burning sensation of the feet well controlled with gabapentin 100 mg tablet by mouth 3 times daily.  Rx refill sent to pharmacy.       Relevant Medications   gabapentin (NEURONTIN) 100 MG capsule   Elevated cholesterol - Primary    Completed lipid panel, CBC, CMP, results pending.  Encouraged low-cholesterol diet, exercise at least 3 times a week.       Relevant Orders   Lipid Panel    Meds ordered this encounter  Medications   gabapentin (NEURONTIN) 100 MG capsule    Sig: Take 1 capsule (100 mg total) by mouth 3 (three) times daily.    Dispense:  90 capsule    Refill:  2    Order Specific Question:   Supervising Provider    Answer:   Janora Norlander [id number]    Follow-up: Return if symptoms worsen or fail to improve.    Ivy Lynn, NP

## 2021-04-04 NOTE — Assessment & Plan Note (Signed)
Generalized anxiety well controlled on BuSpar 10 mg tablet by mouth 3 times daily.Marland Kitchen

## 2021-06-06 ENCOUNTER — Other Ambulatory Visit: Payer: Self-pay

## 2021-06-06 ENCOUNTER — Ambulatory Visit (INDEPENDENT_AMBULATORY_CARE_PROVIDER_SITE_OTHER): Payer: Medicaid Other

## 2021-06-06 DIAGNOSIS — Z23 Encounter for immunization: Secondary | ICD-10-CM | POA: Diagnosis not present

## 2021-07-06 ENCOUNTER — Ambulatory Visit (INDEPENDENT_AMBULATORY_CARE_PROVIDER_SITE_OTHER): Payer: Medicaid Other | Admitting: Nurse Practitioner

## 2021-07-06 ENCOUNTER — Other Ambulatory Visit: Payer: Self-pay

## 2021-07-06 ENCOUNTER — Telehealth: Payer: Self-pay | Admitting: Nurse Practitioner

## 2021-07-06 ENCOUNTER — Encounter: Payer: Self-pay | Admitting: Nurse Practitioner

## 2021-07-06 VITALS — BP 134/84 | HR 95 | Temp 96.6°F | Resp 20 | Ht 65.0 in | Wt 164.0 lb

## 2021-07-06 DIAGNOSIS — F411 Generalized anxiety disorder: Secondary | ICD-10-CM

## 2021-07-06 DIAGNOSIS — E7800 Pure hypercholesterolemia, unspecified: Secondary | ICD-10-CM

## 2021-07-06 DIAGNOSIS — K2100 Gastro-esophageal reflux disease with esophagitis, without bleeding: Secondary | ICD-10-CM | POA: Diagnosis not present

## 2021-07-06 DIAGNOSIS — Z23 Encounter for immunization: Secondary | ICD-10-CM | POA: Diagnosis not present

## 2021-07-06 LAB — LIPID PANEL
Chol/HDL Ratio: 4.6 ratio — ABNORMAL HIGH (ref 0.0–4.4)
Cholesterol, Total: 251 mg/dL — ABNORMAL HIGH (ref 100–199)
HDL: 55 mg/dL (ref >39)
LDL Chol Calc (NIH): 147 mg/dL — ABNORMAL HIGH (ref 0–99)
Triglycerides: 267 mg/dL — ABNORMAL HIGH (ref 0–149)
VLDL Cholesterol Cal: 49 mg/dL — ABNORMAL HIGH (ref 5–40)

## 2021-07-06 LAB — CBC WITH DIFFERENTIAL/PLATELET
Basophils Absolute: 0.1 10*3/uL (ref 0.0–0.2)
Basos: 1 %
EOS (ABSOLUTE): 0.7 10*3/uL — ABNORMAL HIGH (ref 0.0–0.4)
Eos: 7 %
Hematocrit: 37.7 % (ref 34.0–46.6)
Hemoglobin: 12.5 g/dL (ref 11.1–15.9)
Immature Grans (Abs): 0 10*3/uL (ref 0.0–0.1)
Immature Granulocytes: 0 %
Lymphocytes Absolute: 3.6 10*3/uL — ABNORMAL HIGH (ref 0.7–3.1)
Lymphs: 36 %
MCH: 30.4 pg (ref 26.6–33.0)
MCHC: 33.2 g/dL (ref 31.5–35.7)
MCV: 92 fL (ref 79–97)
Monocytes Absolute: 0.9 10*3/uL (ref 0.1–0.9)
Monocytes: 9 %
Neutrophils Absolute: 4.9 10*3/uL (ref 1.4–7.0)
Neutrophils: 47 %
Platelets: 325 10*3/uL (ref 150–450)
RBC: 4.11 x10E6/uL (ref 3.77–5.28)
RDW: 13.1 % (ref 11.7–15.4)
WBC: 10.2 10*3/uL (ref 3.4–10.8)

## 2021-07-06 LAB — COMPREHENSIVE METABOLIC PANEL
ALT: 13 IU/L (ref 0–32)
AST: 19 IU/L (ref 0–40)
Albumin/Globulin Ratio: 2 (ref 1.2–2.2)
Albumin: 4.6 g/dL (ref 3.8–4.8)
Alkaline Phosphatase: 86 IU/L (ref 44–121)
BUN/Creatinine Ratio: 18 (ref 12–28)
BUN: 16 mg/dL (ref 8–27)
Bilirubin Total: 0.2 mg/dL (ref 0.0–1.2)
CO2: 24 mmol/L (ref 20–29)
Calcium: 9.2 mg/dL (ref 8.7–10.3)
Chloride: 105 mmol/L (ref 96–106)
Creatinine, Ser: 0.88 mg/dL (ref 0.57–1.00)
Globulin, Total: 2.3 g/dL (ref 1.5–4.5)
Glucose: 90 mg/dL (ref 70–99)
Potassium: 3.7 mmol/L (ref 3.5–5.2)
Sodium: 143 mmol/L (ref 134–144)
Total Protein: 6.9 g/dL (ref 6.0–8.5)
eGFR: 75 mL/min/{1.73_m2} (ref >59)

## 2021-07-06 NOTE — Telephone Encounter (Signed)
Pt is requesting to switch providers. Pt would like to switch to [facility].   If approved, please switch patients 6 mth appt that is scheduled in May 2023 for her to see Tiffany instead on [facility].

## 2021-07-06 NOTE — Progress Notes (Signed)
New Patient Office Visit  Subjective:  Patient ID: Nancy Delacruz, female    DOB: [birth year]  Age: 60 y.o. MRN: [id number]  CC:  Chief Complaint  Patient presents with   Gastroesophageal Reflux   Anxiety   Hyperlipidemia    HPI [place] presents for GERD, Follow up:  The patient was last seen for GERD 3 months ago. Changes made since that visit include no changes to current medication regimen.  She reports good compliance with treatment. She is not having side effects. .  She IS experiencing  no side effects . She is NOT experiencing abdominal bloating, belching, belching and eructation, bilious reflux, chest pain, choking on food, cough, deep pressure at base of neck, difficulty swallowing, or dysphagia   Anxiety, Follow-up  She was last seen for anxiety 3 months ago. Changes made at last visit include .   She reports good compliance with treatment. She reports good tolerance of treatment. She is not having side effects.   She feels her anxiety is mild and Improved since last visit.  Symptoms: No chest pain No difficulty concentrating  Yes dizziness No fatigue  Yes feelings of losing control No insomnia  Yes irritable No palpitations  No panic attacks No racing thoughts  No shortness of breath No sweating  No tremors/shakes    GAD-7 Results GAD-7 Generalized Anxiety Disorder Screening Tool 07/06/2021 04/04/2021 11/10/2020  1. Feeling Nervous, Anxious, or on Edge 1 0 0  2. Not Being Able to Stop or Control Worrying 0 0 0  3. Worrying Too Much About Different Things 0 0 0  4. Trouble Relaxing 1 0 0  5. Being So Restless it's Hard To Sit Still 1 0 0  6. Becoming Easily Annoyed or Irritable 1 0 0  7. Feeling Afraid As If Something Awful Might Happen 0 - 0  Total GAD-7 Score 4 - 0  Difficulty At Work, Home, or Getting  Along With Others? Not difficult at all - -    PHQ-9 Scores PHQ9 SCORE ONLY 07/06/2021 04/04/2021 12/22/2020  PHQ-9 Total Score 5 0 0     Mixed hyperlipidemia  Patient presents with hyperlipidemia. Patient had elevated cholesterol on last lab.  Plan was for diet and exercise with low-cholesterol diet.  The patient is compliant with plan, maintains a low cholesterol diet , follows up as directed , and maintains an exercise regimen . The patient denies experiencing any hypercholesterolemia related symptoms.  Labs completed results pending.   Past Medical History:  Diagnosis Date   Allergy    Anemia    Anxiety    Arthritis    Bipolar 1 disorder ([place])    Clotting disorder ([place])    had a blood clot near liver, one time   COPD (chronic obstructive pulmonary disease) (HCC)    Depression    Elevated cholesterol 04/04/2021   GERD (gastroesophageal reflux disease)    History of kidney stones     Past Surgical History:  Procedure Laterality Date   BIOPSY  09/12/2016   Procedure: BIOPSY;  Surgeon: Danie Binder, MD;  [facility];  Service: Endoscopy;;  random colon bx's, duodenal bx's, gastric bx's   CHOLECYSTECTOMY  2005   COLONOSCOPY WITH PROPOFOL N/A 09/12/2016   Dr. Oneida Alar: Random colon biopsies negative, external hemorrhoids, two 3-5 mm polyps in the transverse colon removed were tubular adenomas. Next colonoscopy in 5 years   ESOPHAGOGASTRODUODENOSCOPY (EGD) WITH PROPOFOL N/A 09/12/2016   Dr. Oneida Alar: Benign-appearing peptic stricture  status post dilation, mild erosive gastritis with benign biopsies. Small bowel biopsies negative for celiac.   HAND SURGERY     left, tendon   POLYPECTOMY  09/12/2016   Procedure: POLYPECTOMY;  Surgeon: Danie Binder, MD;  [facility];  Service: Endoscopy;;  transverse colon polyps x2   SAVORY DILATION N/A 09/12/2016   Procedure: SAVORY DILATION;  Surgeon: Danie Binder, MD;  [facility];  Service: Endoscopy;  Laterality: N/A;    Family History  Problem Relation Age of Onset   Diabetes Mother    Heart disease Mother 43   Hypertension Mother     Diabetes Father    Alcohol abuse Father    Heart disease Father 69   Hyperlipidemia Father    Hypertension Father    Cancer Brother        ?lung   Cancer Brother        BONES   Colon cancer Neg Hx     Social History   Socioeconomic History   Marital status: Divorced    Spouse name: Not on file   Number of children: 0   Years of education: 12   Highest education level: Not on file  Occupational History   Occupation: unemployed    Comment: "back, copd , nerves"  Tobacco Use   Smoking status: Some Days    Packs/day: 0.25    Types: Cigarettes    Start date: 2008   Smokeless tobacco: Never  Vaping Use   Vaping Use: Never used  Substance and Sexual Activity   Alcohol use: No   Drug use: No   Sexual activity: Not Currently    Birth control/protection: Post-menopausal  Other Topics Concern   Not on file  Social History Narrative   Homeless   Lives with cousin   Is able to drive   Trying to get disability   Social Determinants of Health   Financial Resource Strain: Low Risk    Difficulty of Paying Living Expenses: Not very hard  Food Insecurity: No Food Insecurity   Worried About Charity fundraiser in the Last Year: Never true   [place] in the Last Year: Never true  Transportation Needs: No Transportation Needs   Lack of Transportation (Medical): No   Lack of Transportation (Non-Medical): No  Physical Activity: Inactive   Days of Exercise per Week: 0 days   Minutes of Exercise per Session: 100 min  Stress: No Stress Concern Present   Feeling of Stress : Not at all  Social Connections: Socially Isolated   Frequency of Communication with Friends and Family: More than three times a week   Frequency of Social Gatherings with Friends and Family: More than three times a week   Attends Religious Services: Never   Marine scientist or Organizations: No   Attends Music therapist: Never   Marital Status: Divorced  Human resources officer Violence:  Not At Risk   Fear of Current or Ex-Partner: No   Emotionally Abused: No   Physically Abused: No   Sexually Abused: No    ROS Review of Systems  Constitutional: Negative.   HENT: Negative.    Respiratory: Negative.    Cardiovascular: Negative.   Gastrointestinal: Negative.   Genitourinary: Negative.   Musculoskeletal: Negative.   Psychiatric/Behavioral: Negative.    All other systems reviewed and are negative.  Objective:   Today's Vitals: BP 134/84   Pulse 95   Temp (!) 96.6 F (35.9  C) (Temporal)   Resp 20   Ht 5\' 5"  (1.651 m)   Wt 164 lb (74.4 kg)   SpO2 95%   BMI 27.29 kg/m   Physical Exam Vitals reviewed.  Constitutional:      Appearance: Normal appearance.  HENT:     Head: Normocephalic.     Right Ear: Ear canal and external ear normal.     Left Ear: Ear canal and external ear normal.     Mouth/Throat:     Mouth: Mucous membranes are moist.  Eyes:     Conjunctiva/sclera: Conjunctivae normal.  Cardiovascular:     Rate and Rhythm: Normal rate and regular rhythm.     Pulses: Normal pulses.     Heart sounds: Normal heart sounds.  Pulmonary:     Effort: Pulmonary effort is normal.     Breath sounds: Normal breath sounds.  Abdominal:     General: Bowel sounds are normal.  Musculoskeletal:        General: Normal range of motion.  Skin:    General: Skin is warm.     Findings: No rash.  Neurological:     Mental Status: She is alert and oriented to person, place, and time.    Assessment & Plan:   Problem List Items Addressed This Visit       Digestive   Gastroesophageal reflux disease with esophagitis    No changes to current management.  Symptoms well controlled.      Relevant Orders   CBC with Differential   Comprehensive metabolic panel   Lipid Panel     Other   Generalized anxiety disorder    Anxiety controlled on current management.  Follow-up in 3 to 6 months.      Elevated cholesterol    Completed lipid panel results pending.  Plan  is to start patient on low-dose statin if cholesterol is not therapeutic.  Education provided to continue exercise,  and low-cholesterol diet.  Printed handouts given.  Follow-up in 3 months.      Relevant Orders   Lipid Panel   Other Visit Diagnoses     Need for immunization against influenza    -  Primary   Relevant Orders   Flu Vaccine QUAD 80mo+IM (Fluarix, Fluzone & Alfiuria Quad PF) (Completed)       Outpatient Encounter Medications as of 07/06/2021  Medication Sig   busPIRone (BUSPAR) 10 MG tablet Take 10 mg by mouth 3 (three) times daily.   conjugated estrogens (PREMARIN) vaginal cream Use 0.5 gm in vagina at bedtime, every day for 2 weeks then 2-3 x a week   gabapentin (NEURONTIN) 100 MG capsule Take 1 capsule (100 mg total) by mouth 3 (three) times daily.   HYDROcodone-acetaminophen (NORCO) 10-325 MG tablet 1 tablet every 6 (six) hours as needed.   methylphenidate (RITALIN) 20 MG tablet Take 20 mg by mouth 3 (three) times daily.   No facility-administered encounter medications on file as of 07/06/2021.    Follow-up: Return in about 6 months (around 01/03/2022).   Ivy Lynn, NP

## 2021-07-06 NOTE — Assessment & Plan Note (Signed)
Anxiety controlled on current management.  Follow-up in 3 to 6 months.

## 2021-07-06 NOTE — Assessment & Plan Note (Signed)
No changes to current management.  Symptoms well controlled.

## 2021-07-06 NOTE — Telephone Encounter (Signed)
Patient aware.

## 2021-07-06 NOTE — Assessment & Plan Note (Signed)
Completed lipid panel results pending.  Plan is to start patient on low-dose statin if cholesterol is not therapeutic.  Education provided to continue exercise,  and low-cholesterol diet.  Printed handouts given.  Follow-up in 3 months.

## 2021-07-06 NOTE — Patient Instructions (Signed)
Generalized Anxiety Disorder, Adult ?Generalized anxiety disorder (GAD) is a mental health condition. Unlike normal worries, anxiety related to GAD is not triggered by a specific event. These worries do not fade or get better with time. GAD interferes with relationships, work, and school. ?GAD symptoms can vary from mild to severe. People with severe GAD can have intense waves of anxiety with physical symptoms that are similar to panic attacks. ?What are the causes? ?The exact cause of GAD is not known, but the following are believed to have an impact: ?Differences in natural brain chemicals. ?Genes passed down from parents to children. ?Differences in the way threats are perceived. ?Development and stress during childhood. ?Personality. ?What increases the risk? ?The following factors may make you more likely to develop this condition: ?Being female. ?Having a family history of anxiety disorders. ?Being very shy. ?Experiencing very stressful life events, such as the death of a loved one. ?Having a very stressful family environment. ?What are the signs or symptoms? ?People with GAD often worry excessively about many things in their lives, such as their health and family. Symptoms may also include: ?Mental and emotional symptoms: ?Worrying excessively about natural disasters. ?Fear of being late. ?Difficulty concentrating. ?Fears that others are judging your performance. ?Physical symptoms: ?Fatigue. ?Headaches, muscle tension, muscle twitches, trembling, or feeling shaky. ?Feeling like your heart is pounding or beating very fast. ?Feeling out of breath or like you cannot take a deep breath. ?Having trouble falling asleep or staying asleep, or experiencing restlessness. ?Sweating. ?Nausea, diarrhea, or irritable bowel syndrome (IBS). ?Behavioral symptoms: ?Experiencing erratic moods or irritability. ?Avoidance of new situations. ?Avoidance of people. ?Extreme difficulty making decisions. ?How is this diagnosed? ?This  condition is diagnosed based on your symptoms and medical history. You will also have a physical exam. Your health care provider may perform tests to rule out other possible causes of your symptoms. ?To be diagnosed with GAD, a person must have anxiety that: ?Is out of his or her control. ?Affects several different aspects of his or her life, such as work and relationships. ?Causes distress that makes him or her unable to take part in normal activities. ?Includes at least three symptoms of GAD, such as restlessness, fatigue, trouble concentrating, irritability, muscle tension, or sleep problems. ?Before your health care provider can confirm a diagnosis of GAD, these symptoms must be present more days than they are not, and they must last for 6 months or longer. ?How is this treated? ?This condition may be treated with: ?Medicine. Antidepressant medicine is usually prescribed for long-term daily control. Anti-anxiety medicines may be added in severe cases, especially when panic attacks occur. ?Talk therapy (psychotherapy). Certain types of talk therapy can be helpful in treating GAD by providing support, education, and guidance. Options include: ?Cognitive behavioral therapy (CBT). People learn coping skills and self-calming techniques to ease their physical symptoms. They learn to identify unrealistic thoughts and behaviors and to replace them with more appropriate thoughts and behaviors. ?Acceptance and commitment therapy (ACT). This treatment teaches people how to be mindful as a way to cope with unwanted thoughts and feelings. ?Biofeedback. This process trains you to manage your body's response (physiological response) through breathing techniques and relaxation methods. You will work with a therapist while machines are used to monitor your physical symptoms. ?Stress management techniques. These include yoga, meditation, and exercise. ?A mental health specialist can help determine which treatment is best for you.  Some people see improvement with one type of therapy. However, other people require   a combination of therapies. Follow these instructions at home: Lifestyle Maintain a consistent routine and schedule. Anticipate stressful situations. Create a plan and allow extra time to work with your plan. Practice stress management or self-calming techniques that you have learned from your therapist or your health care provider. Exercise regularly and spend time outdoors. Eat a healthy diet that includes plenty of vegetables, fruits, whole grains, low-fat dairy products, and lean protein. Do not eat a lot of foods that are high in fat, added sugar, or salt (sodium). Drink plenty of water. Avoid alcohol. Alcohol can increase anxiety. Avoid caffeine and certain over-the-counter cold medicines. These may make you feel worse. Ask your pharmacist which medicines to avoid. General instructions Take over-the-counter and prescription medicines only as told by your health care provider. Understand that you are likely to have setbacks. Accept this and be kind to yourself as you persist to take better care of yourself. Anticipate stressful situations. Create a plan and allow extra time to work with your plan. Recognize and accept your accomplishments, even if you judge them as small. Spend time with people who care about you. Keep all follow-up visits. This is important. Where to find more information [place]: https://carter.com/ Substance Abuse and Mental Health Services: ktimeonline.com Contact a health care provider if: Your symptoms do not get better. Your symptoms get worse. You have signs of depression, such as: A persistently sad or irritable mood. Loss of enjoyment in activities that used to bring you joy. Change in weight or eating. Changes in sleeping habits. Get help right away if: You have thoughts about hurting yourself or others. If you ever feel like you may hurt  yourself or others, or have thoughts about taking your own life, get help right away. Go to your nearest emergency department or: Call your local emergency services (911 in the [place] Call a suicide crisis helpline, such as the [facility] at 512-766-3009 or 988 in the [place]. This is open 24 hours a day in the [place] Text the Crisis Text Line at 210-159-1759 (in the [place].). Summary Generalized anxiety disorder (GAD) is a mental health condition that involves worry that is not triggered by a specific event. People with GAD often worry excessively about many things in their lives, such as their health and family. GAD may cause symptoms such as restlessness, trouble concentrating, sleep problems, frequent sweating, nausea, diarrhea, headaches, and trembling or muscle twitching. A mental health specialist can help determine which treatment is best for you. Some people see improvement with one type of therapy. However, other people require a combination of therapies. This information is not intended to replace advice given to you by your health care provider. Make sure you discuss any questions you have with your health care provider. Document Revised: 03/09/2021 Document Reviewed: 12/05/2020 Elsevier Patient Education  [place]. Gastroesophageal Reflux Disease, Adult Gastroesophageal reflux (GER) happens when acid from the stomach flows up into the tube that connects the mouth and the stomach (esophagus). Normally, food travels down the esophagus and stays in the stomach to be digested. With GER, food and stomach acid sometimes move back up into the esophagus. You may have a disease called gastroesophageal reflux disease (GERD) if the reflux: Happens often. Causes frequent or very bad symptoms. Causes problems such as damage to the esophagus. When this happens, the esophagus becomes sore and swollen. Over time, GERD can make small holes (ulcers) in the lining of the  esophagus. What are  the causes? This condition is caused by a problem with the muscle between the esophagus and the stomach. When this muscle is weak or not normal, it does not close properly to keep food and acid from coming back up from the stomach. The muscle can be weak because of: Tobacco use. Pregnancy. Having a certain type of hernia (hiatal hernia). Alcohol use. Certain foods and drinks, such as coffee, chocolate, onions, and peppermint. What increases the risk? Being overweight. Having a disease that affects your connective tissue. Taking NSAIDs, such a ibuprofen. What are the signs or symptoms? Heartburn. Difficult or painful swallowing. The feeling of having a lump in the throat. A bitter taste in the mouth. Bad breath. Having a lot of saliva. Having an upset or bloated stomach. Burping. Chest pain. Different conditions can cause chest pain. Make sure you see your doctor if you have chest pain. Shortness of breath or wheezing. A long-term cough or a cough at night. Wearing away of the surface of teeth (tooth enamel). Weight loss. How is this treated? Making changes to your diet. Taking medicine. Having surgery. Treatment will depend on how bad your symptoms are. Follow these instructions at home: Eating and drinking  Follow a diet as told by your doctor. You may need to avoid foods and drinks such as: Coffee and tea, with or without caffeine. Drinks that contain alcohol. Energy drinks and sports drinks. Bubbly (carbonated) drinks or sodas. Chocolate and cocoa. Peppermint and mint flavorings. Garlic and onions. Horseradish. Spicy and acidic foods. These include peppers, chili powder, curry powder, vinegar, hot sauces, and BBQ sauce. Citrus fruit juices and citrus fruits, such as oranges, lemons, and limes. Tomato-based foods. These include red sauce, chili, salsa, and pizza with red sauce. Fried and fatty foods. These include donuts, french fries, potato  chips, and high-fat dressings. High-fat meats. These include hot dogs, rib eye steak, sausage, ham, and bacon. High-fat dairy items, such as whole milk, butter, and cream cheese. Eat small meals often. Avoid eating large meals. Avoid drinking large amounts of liquid with your meals. Avoid eating meals during the 2-3 hours before bedtime. Avoid lying down right after you eat. Do not exercise right after you eat. Lifestyle  Do not smoke or use any products that contain nicotine or tobacco. If you need help quitting, ask your doctor. Try to lower your stress. If you need help doing this, ask your doctor. If you are overweight, lose an amount of weight that is healthy for you. Ask your doctor about a safe weight loss goal. General instructions Pay attention to any changes in your symptoms. Take over-the-counter and prescription medicines only as told by your doctor. Do not take aspirin, ibuprofen, or other NSAIDs unless your doctor says it is okay. Wear loose clothes. Do not wear anything tight around your waist. Raise (elevate) the head of your bed about 6 inches (15 cm). You may need to use a wedge to do this. Avoid bending over if this makes your symptoms worse. Keep all follow-up visits. Contact a doctor if: You have new symptoms. You lose weight and you do not know why. You have trouble swallowing or it hurts to swallow. You have wheezing or a cough that keeps happening. You have a hoarse voice. Your symptoms do not get better with treatment. Get help right away if: You have sudden pain in your arms, neck, jaw, teeth, or back. You suddenly feel sweaty, dizzy, or light-headed. You have chest pain or shortness of breath. You  vomit and the vomit is green, yellow, or black, or it looks like blood or coffee grounds. You faint. Your poop (stool) is red, bloody, or black. You cannot swallow, drink, or eat. These symptoms may represent a serious problem that is an emergency. Do not wait  to see if the symptoms will go away. Get medical help right away. Call your local emergency services (911 in the [place] Do not drive yourself to the hospital. Summary If a person has gastroesophageal reflux disease (GERD), food and stomach acid move back up into the esophagus and cause symptoms or problems such as damage to the esophagus. Treatment will depend on how bad your symptoms are. Follow a diet as told by your doctor. Take all medicines only as told by your doctor. This information is not intended to replace advice given to you by your health care provider. Make sure you discuss any questions you have with your health care provider. Document Revised: 02/23/2020 Document Reviewed: 02/23/2020 Elsevier Patient Education  [place].

## 2021-07-06 NOTE — Telephone Encounter (Signed)
That is fine 

## 2021-07-13 ENCOUNTER — Ambulatory Visit (INDEPENDENT_AMBULATORY_CARE_PROVIDER_SITE_OTHER): Payer: Medicaid Other | Admitting: Nurse Practitioner

## 2021-07-13 ENCOUNTER — Encounter: Payer: Self-pay | Admitting: Nurse Practitioner

## 2021-07-13 DIAGNOSIS — R0981 Nasal congestion: Secondary | ICD-10-CM | POA: Insufficient documentation

## 2021-07-13 DIAGNOSIS — R197 Diarrhea, unspecified: Secondary | ICD-10-CM | POA: Diagnosis not present

## 2021-07-13 MED ORDER — LOPERAMIDE HCL 2 MG PO TABS
2.0000 mg | ORAL_TABLET | Freq: Four times a day (QID) | ORAL | 0 refills | Status: AC | PRN
Start: 2021-07-13 — End: ?

## 2021-07-13 MED ORDER — GUAIFENESIN ER 600 MG PO TB12: 600.0000 mg | ORAL_TABLET | Freq: Two times a day (BID) | ORAL | 0 refills | Status: AC

## 2021-07-13 MED ORDER — FLUTICASONE PROPIONATE 50 MCG/ACT NA SUSP: 2.0000 | Freq: Every day | NASAL | 6 refills | Status: AC

## 2021-07-13 NOTE — Assessment & Plan Note (Signed)
-  Start Imodium 2 mg tablet by mouth -Increase hydration -Brat diet recommended

## 2021-07-13 NOTE — Assessment & Plan Note (Signed)
Take meds as prescribed - Use a cool mist humidifier  -Use saline nose sprays frequently -Force fluids -For fever or aches or pains- take Tylenol or ibuprofen. -If symptoms do not improve, she may need to be COVID tested to rule this out Follow up with worsening unresolved symptoms

## 2021-07-13 NOTE — Progress Notes (Signed)
   Virtual Visit  Note Due to COVID-19 pandemic this visit was conducted virtually. This visit type was conducted due to national recommendations for restrictions regarding the COVID-19 Pandemic (e.g. social distancing, sheltering in place) in an effort to limit this patient's exposure and mitigate transmission in our community. All issues noted in this document were discussed and addressed.  A physical exam was not performed with this format.  I connected with [name] on 07/13/21 at 3:00 pm  by telephone and verified that I am speaking with the correct person using two identifiers. Nancy Delacruz [facility] is currently located at home during visit. The provider, Ivy Lynn, NP is located in their office at time of visit.  I discussed the limitations, risks, security and privacy concerns of performing an evaluation and management service by telephone and the availability of in person appointments. I also discussed with the patient that there may be a patient responsible charge related to this service. The patient expressed understanding and agreed to proceed.   History and Present Illness:  Diarrhea  This is a new problem. The current episode started yesterday. The problem occurs 2 to 4 times per day. The problem has been unchanged. The patient states that diarrhea does not awaken her from sleep. Associated symptoms include a URI. Pertinent negatives include no abdominal pain, chills, coughing or headaches. Nothing aggravates the symptoms.  URI  This is a new problem. The current episode started yesterday. The problem has been unchanged. There has been no fever. Associated symptoms include congestion and diarrhea. Pertinent negatives include no abdominal pain, coughing or headaches.     Review of Systems  Constitutional:  Negative for chills.  HENT:  Positive for congestion.   Respiratory:  Negative for cough.   Gastrointestinal:  Positive for diarrhea. Negative for abdominal pain.   Neurological:  Negative for headaches.    Observations/Objective: Televisit patient not in distress  Assessment and Plan: Take meds as prescribed - Use a cool mist humidifier  -Use saline nose sprays frequently -Force fluids -For fever or aches or pains- take Tylenol or ibuprofen. -If symptoms do not improve, she may need to be COVID tested to rule this out Follow up with worsening unresolved symptoms    For diarrhea -Start Imodium 2 mg tablet by mouth -Increase hydration -Brat diet recommended  Follow Up Instructions: Follow-up with unresolved symptoms    I discussed the assessment and treatment plan with the patient. The patient was provided an opportunity to ask questions and all were answered. The patient agreed with the plan and demonstrated an understanding of the instructions.   The patient was advised to call back or seek an in-person evaluation if the symptoms worsen or if the condition fails to improve as anticipated.  The above assessment and management plan was discussed with the patient. The patient verbalized understanding of and has agreed to the management plan. Patient is aware to call the clinic if symptoms persist or worsen. Patient is aware when to return to the clinic for a follow-up visit. Patient educated on when it is appropriate to go to the emergency department.   Time call ended: 3:10 PM  I provided 10 minutes of  non face-to-face time during this encounter.    Ivy Lynn, NP

## 2021-07-19 ENCOUNTER — Telehealth: Payer: Self-pay | Admitting: Family Medicine

## 2021-07-19 NOTE — Telephone Encounter (Signed)
Do not see note attached or new referral placed- please advise

## 2021-07-19 NOTE — Telephone Encounter (Signed)
Pt came in stating that she wants provider to refer her to see different pain clinic because current pain clinic [facility]) is not helping her.   Please advise and call patient.

## 2021-07-20 NOTE — Telephone Encounter (Signed)
Patient seen Nancy Delacruz would you refer to different pain clinic

## 2021-07-20 NOTE — Telephone Encounter (Signed)
Patient has not seen tiffany yet will need to schedule an appointment with her to discuss when she calls back tried to call patient multiple times please schedule patient with pcp when she calls back.

## 2021-07-20 NOTE — Telephone Encounter (Signed)
I have not seen this patient before. I am not sure how she became my patient. Please have her schedule an appointment with me for this.

## 2021-07-25 ENCOUNTER — Encounter: Payer: Self-pay | Admitting: Family Medicine

## 2021-07-25 ENCOUNTER — Other Ambulatory Visit: Payer: Self-pay

## 2021-07-25 ENCOUNTER — Ambulatory Visit (INDEPENDENT_AMBULATORY_CARE_PROVIDER_SITE_OTHER): Payer: Medicaid Other | Admitting: Family Medicine

## 2021-07-25 VITALS — BP 149/86 | HR 90 | Temp 97.8°F | Ht 65.0 in | Wt 164.0 lb

## 2021-07-25 DIAGNOSIS — Z79891 Long term (current) use of opiate analgesic: Secondary | ICD-10-CM | POA: Diagnosis not present

## 2021-07-25 DIAGNOSIS — Z23 Encounter for immunization: Secondary | ICD-10-CM | POA: Diagnosis not present

## 2021-07-25 DIAGNOSIS — M5030 Other cervical disc degeneration, unspecified cervical region: Secondary | ICD-10-CM

## 2021-07-25 DIAGNOSIS — E785 Hyperlipidemia, unspecified: Secondary | ICD-10-CM

## 2021-07-25 DIAGNOSIS — G629 Polyneuropathy, unspecified: Secondary | ICD-10-CM | POA: Diagnosis not present

## 2021-07-25 DIAGNOSIS — M5136 Other intervertebral disc degeneration, lumbar region: Secondary | ICD-10-CM | POA: Diagnosis not present

## 2021-07-25 DIAGNOSIS — M51369 Other intervertebral disc degeneration, lumbar region without mention of lumbar back pain or lower extremity pain: Secondary | ICD-10-CM | POA: Insufficient documentation

## 2021-07-25 DIAGNOSIS — I10 Essential (primary) hypertension: Secondary | ICD-10-CM

## 2021-07-25 NOTE — Progress Notes (Signed)
Acute Office Visit  Subjective:    Patient ID: Nancy Delacruz [facility], female    DOB: 1961-05-08, 60 y.o.   MRN: [id number]  Chief Complaint  Patient presents with   Pain    HPI Patient is in today for pain management referral. She has been managed by neurology for migraines, chronic neck and  back pain, and neuropathy. She had been seeing the NP at [facility] neurology and now is having to follow up with the MD there and she is not happy about this. She would like to establish with a pain management clinic. She takes gabapentin for neuropathy. She was taking norco for her chronic pain. This has now been changed to belbuca strips.   She also sees a psychiatrist who prescribes ritalin 20 mg TID for ADHD.   She has a history of HTN and HLD. She does not wish to take medication for this. She plans to start exercising and eating a healthier diet.   Depression screen [facility] 2/9 07/25/2021 07/06/2021 04/04/2021  Decreased Interest 0 1 0  Down, Depressed, Hopeless 0 0 0  PHQ - 2 Score 0 1 0  Altered sleeping 0 1 0  Tired, decreased energy 0 1 0  Change in appetite 0 0 0  Feeling bad or failure about yourself  0 0 0  Trouble concentrating 0 1 0  Moving slowly or fidgety/restless 0 1 0  Suicidal thoughts 0 0 0  PHQ-9 Score 0 5 0  Difficult doing work/chores Not difficult at all Not difficult at all -  Some recent data might be hidden   GAD 7 : Generalized Anxiety Score 07/25/2021 07/06/2021 04/04/2021 11/10/2020  Nervous, Anxious, on Edge 0 1 0 0  Control/stop worrying 0 0 0 0  Worry too much - different things 0 0 0 0  Trouble relaxing 0 1 0 0  Restless 0 1 0 0  Easily annoyed or irritable 0 1 0 0  Afraid - awful might happen 0 0 - 0  Total GAD 7 Score 0 4 - 0  Anxiety Difficulty Not difficult at all Not difficult at all - -     Past Medical History:  Diagnosis Date   Allergy    Anemia    Anxiety    Arthritis    Bipolar 1 disorder (HCC)    Clotting disorder (HCC)    had a blood clot near  liver, one time   COPD (chronic obstructive pulmonary disease) (HCC)    Depression    Elevated cholesterol 04/04/2021   GERD (gastroesophageal reflux disease)    History of kidney stones     Past Surgical History:  Procedure Laterality Date   BIOPSY  09/12/2016   Procedure: BIOPSY;  Surgeon: Danie Binder, MD;  Location: [facility];  Service: Endoscopy;;  random colon bx's, duodenal bx's, gastric bx's   CHOLECYSTECTOMY  2005   COLONOSCOPY WITH PROPOFOL N/A 09/12/2016   Dr. Oneida Alar: Random colon biopsies negative, external hemorrhoids, two 3-5 mm polyps in the transverse colon removed were tubular adenomas. Next colonoscopy in 5 years   ESOPHAGOGASTRODUODENOSCOPY (EGD) WITH PROPOFOL N/A 09/12/2016   Dr. Oneida Alar: Benign-appearing peptic stricture status post dilation, mild erosive gastritis with benign biopsies. Small bowel biopsies negative for celiac.   HAND SURGERY     left, tendon   POLYPECTOMY  09/12/2016   Procedure: POLYPECTOMY;  Surgeon: Danie Binder, MD;  Location: [facility];  Service: Endoscopy;;  transverse colon polyps x2   SAVORY DILATION  N/A 09/12/2016   Procedure: SAVORY DILATION;  Surgeon: Danie Binder, MD;  Location: [facility];  Service: Endoscopy;  Laterality: N/A;    Family History  Problem Relation Age of Onset   Diabetes Mother    Heart disease Mother 30   Hypertension Mother    Diabetes Father    Alcohol abuse Father    Heart disease Father 9   Hyperlipidemia Father    Hypertension Father    Cancer Brother        ?lung   Cancer Brother        BONES   Colon cancer Neg Hx     Social History   Socioeconomic History   Marital status: Divorced    Spouse name: Not on file   Number of children: 0   Years of education: 12   Highest education level: Not on file  Occupational History   Occupation: unemployed    Comment: "back, copd , nerves"  Tobacco Use   Smoking status: Some Days    Packs/day: 0.25    Types: Cigarettes    Start date: 2008    Smokeless tobacco: Never  Vaping Use   Vaping Use: Never used  Substance and Sexual Activity   Alcohol use: No   Drug use: No   Sexual activity: Not Currently    Birth control/protection: Post-menopausal  Other Topics Concern   Not on file  Social History Narrative   Homeless   Lives with cousin   Is able to drive   Trying to get disability   Social Determinants of Health   Financial Resource Strain: Low Risk    Difficulty of Paying Living Expenses: Not very hard  Food Insecurity: No Food Insecurity   Worried About Charity fundraiser in the Last Year: Never true   [place] in the Last Year: Never true  Transportation Needs: No Transportation Needs   Lack of Transportation (Medical): No   Lack of Transportation (Non-Medical): No  Physical Activity: Inactive   Days of Exercise per Week: 0 days   Minutes of Exercise per Session: 100 min  Stress: No Stress Concern Present   Feeling of Stress : Not at all  Social Connections: Socially Isolated   Frequency of Communication with Friends and Family: More than three times a week   Frequency of Social Gatherings with Friends and Family: More than three times a week   Attends Religious Services: Never   Marine scientist or Organizations: No   Attends Music therapist: Never   Marital Status: Divorced  Human resources officer Violence: Not At Risk   Fear of Current or Ex-Partner: No   Emotionally Abused: No   Physically Abused: No   Sexually Abused: No    Outpatient Medications Prior to Visit  Medication Sig Dispense Refill   BELBUCA 150 MCG FILM Take 1 strip by mouth 2 (two) times daily.     busPIRone (BUSPAR) 10 MG tablet Take 10 mg by mouth 3 (three) times daily.     conjugated estrogens (PREMARIN) vaginal cream Use 0.5 gm in vagina at bedtime, every day for 2 weeks then 2-3 x a week 42.5 g 1   fluticasone (FLONASE) 50 MCG/ACT nasal spray Place 2 sprays into both nostrils daily. 16 g 6   gabapentin  (NEURONTIN) 300 MG capsule Take 300 mg by mouth 2 (two) times daily.     guaiFENesin (MUCINEX) 600 MG 12 hr tablet Take 1 tablet (600 mg total)  by mouth 2 (two) times daily. 30 tablet 0   loperamide (IMODIUM A-D) 2 MG tablet Take 1 tablet (2 mg total) by mouth 4 (four) times daily as needed for diarrhea or loose stools. 30 tablet 0   methylphenidate (RITALIN) 20 MG tablet Take 20 mg by mouth 3 (three) times daily.     gabapentin (NEURONTIN) 100 MG capsule Take 1 capsule (100 mg total) by mouth 3 (three) times daily. 90 capsule 2   HYDROcodone-acetaminophen (NORCO) 10-325 MG tablet 1 tablet every 6 (six) hours as needed.  0   No facility-administered medications prior to visit.    Allergies  Allergen Reactions   Sulfamethoxazole-Trimethoprim Other (See Comments)    RASH AROUND MOUTH RASH AROUND MOUTH    Keflex [Cephalexin] Rash    Review of Systems  Constitutional:  Negative for fever and unexpected weight change.  HENT:  Negative for trouble swallowing.   Respiratory:  Negative for cough, chest tightness, shortness of breath and wheezing.   Cardiovascular:  Negative for chest pain and leg swelling.  Genitourinary:  Negative for difficulty urinating.  Neurological:  Negative for dizziness, facial asymmetry, speech difficulty, weakness, light-headedness, numbness and headaches.  Psychiatric/Behavioral:  Negative for confusion.       Objective:    Physical Exam Vitals and nursing note reviewed.  Constitutional:      General: She is not in acute distress.    Appearance: She is not ill-appearing, toxic-appearing or diaphoretic.  Cardiovascular:     Rate and Rhythm: Normal rate and regular rhythm.     Heart sounds: Normal heart sounds. No murmur heard. Pulmonary:     Effort: Pulmonary effort is normal. No respiratory distress.     Breath sounds: Normal breath sounds.  Abdominal:     General: Bowel sounds are normal. There is no distension.     Palpations: Abdomen is soft.      Tenderness: There is no abdominal tenderness. There is no guarding or rebound.  Musculoskeletal:     Right lower leg: No edema.     Left lower leg: No edema.  Skin:    General: Skin is warm.  Neurological:     Mental Status: She is alert and oriented to person, place, and time.  Psychiatric:        Mood and Affect: Mood normal.        Behavior: Behavior normal.    BP (!) 149/86   Pulse 90   Temp 97.8 F (36.6 C) (Temporal)   Ht '5\' 5"'  (1.651 m)   Wt 164 lb (74.4 kg)   BMI 27.29 kg/m  Wt Readings from Last 3 Encounters:  07/25/21 164 lb (74.4 kg)  07/06/21 164 lb (74.4 kg)  02/09/21 160 lb 8 oz (72.8 kg)    Health Maintenance Due  Topic Date Due   COVID-19 Vaccine (1) Never done    There are no preventive care reminders to display for this patient.   Lab Results  Component Value Date   TSH 0.607 12/08/2020   Lab Results  Component Value Date   WBC 10.2 07/06/2021   HGB 12.5 07/06/2021   HCT 37.7 07/06/2021   MCV 92 07/06/2021   PLT 325 07/06/2021   Lab Results  Component Value Date   NA 143 07/06/2021   K 3.7 07/06/2021   CO2 24 07/06/2021   GLUCOSE 90 07/06/2021   BUN 16 07/06/2021   CREATININE 0.88 07/06/2021   BILITOT 0.2 07/06/2021   ALKPHOS 86 07/06/2021  AST 19 07/06/2021   ALT 13 07/06/2021   PROT 6.9 07/06/2021   ALBUMIN 4.6 07/06/2021   CALCIUM 9.2 07/06/2021   ANIONGAP 9 12/21/2016   EGFR 75 07/06/2021   Lab Results  Component Value Date   CHOL 251 (H) 07/06/2021   Lab Results  Component Value Date   HDL 55 07/06/2021   Lab Results  Component Value Date   LDLCALC 147 (H) 07/06/2021   Lab Results  Component Value Date   TRIG 267 (H) 07/06/2021   Lab Results  Component Value Date   CHOLHDL 4.6 (H) 07/06/2021   No results found for: HGBA1C     Assessment & Plan:   Nancy Delacruz was seen today for pain.  Diagnoses and all orders for this visit:  Long term (current) use of opiate analgesic DDD (degenerative disc disease),  cervical DDD (degenerative disc disease), lumbar Neuropathy Managed by neurology. Referral placed to pain management. Curring on gabapentin and belbuca.  -     Ambulatory referral to Pain Clinic  Primary hypertension Uncontrolled. Patient declined treatment today. Discussed risks of uncontrolled hypertension.   Hyperlipidemia, unspecified hyperlipidemia type The 10-year ASCVD risk score (Arnett DK, et al., 2019) is: 11.6%   Values used to calculate the score:     Age: 53 years     Sex: Female     Is Non-Hispanic African American: No     Diabetic: No     Tobacco smoker: Yes     Systolic Blood Pressure: 413 mmHg     Is BP treated: No     HDL Cholesterol: 55 mg/dL     Total Cholesterol: 251 mg/dL Declined treatment today. Reports she will work on diet and exercise.   Need for vaccination -     Pneumococcal conjugate vaccine 20-valent (Prevnar 20)  Return in about 3 months (around 10/25/2021) for chronic follow up.  The patient indicates understanding of these issues and agrees with the plan.   Gwenlyn Perking, FNP

## 2021-07-26 ENCOUNTER — Telehealth: Payer: Self-pay | Admitting: Family Medicine

## 2021-07-26 ENCOUNTER — Other Ambulatory Visit: Payer: Self-pay | Admitting: Nurse Practitioner

## 2021-07-26 DIAGNOSIS — G629 Polyneuropathy, unspecified: Secondary | ICD-10-CM

## 2021-07-26 DIAGNOSIS — R110 Nausea: Secondary | ICD-10-CM

## 2021-07-26 MED ORDER — DICLOFENAC SODIUM 1 % EX GEL: 2.0000 g | Freq: Four times a day (QID) | CUTANEOUS | 0 refills | Status: AC

## 2021-07-26 MED ORDER — ACETAMINOPHEN 500 MG PO TABS: 500.0000 mg | ORAL_TABLET | Freq: Four times a day (QID) | ORAL | 0 refills | Status: AC | PRN

## 2021-07-26 NOTE — Telephone Encounter (Signed)
Pt aware.

## 2021-07-28 ENCOUNTER — Ambulatory Visit (INDEPENDENT_AMBULATORY_CARE_PROVIDER_SITE_OTHER): Payer: Medicaid Other | Admitting: Family Medicine

## 2021-07-28 ENCOUNTER — Encounter: Payer: Self-pay | Admitting: Family Medicine

## 2021-07-28 DIAGNOSIS — Z79891 Long term (current) use of opiate analgesic: Secondary | ICD-10-CM | POA: Diagnosis not present

## 2021-07-28 DIAGNOSIS — M5136 Other intervertebral disc degeneration, lumbar region: Secondary | ICD-10-CM

## 2021-07-28 NOTE — Progress Notes (Signed)
   Virtual Visit  Note Due to COVID-19 pandemic this visit was conducted virtually. This visit type was conducted due to national recommendations for restrictions regarding the COVID-19 Pandemic (e.g. social distancing, sheltering in place) in an effort to limit this patient's exposure and mitigate transmission in our community. All issues noted in this document were discussed and addressed.  A physical exam was not performed with this format.  I connected with [place] on 07/28/21 at 1435 by telephone and verified that I am speaking with the correct person using two identifiers. Nancy Delacruz is currently located at home and no one is currently with her during the visit. The provider, Gwenlyn Perking, FNP is located in their office at time of visit.  I discussed the limitations, risks, security and privacy concerns of performing an evaluation and management service by telephone and the availability of in person appointments. I also discussed with the patient that there may be a patient responsible charge related to this service. The patient expressed understanding and agreed to proceed.  CC: back pain  History and Present Illness:  HPI Nancy Delacruz reports that the Belbuca strips are not helping with her back pain. This were prescribed by Dr. Merlene Laughter who has been managing her chronic back pain. Dr. Merlene Laughter sent in a 30 day supply for this on 07/19/21. She has been referred to pain management per her request but this is still pending. She would like to know if something else can be sent in her for until she establishes with pain management.     ROS As per HPI.   Observations/Objective: Alert and oriented x 3. Able to speak in full sentences without difficulty.    Assessment and Plan: Nancy Delacruz was seen today for back pain.  Diagnoses and all orders for this visit:  Long term (current) use of opiate analgesic DDD (degenerative disc disease), lumbar Currently on Belbuca as prescribed by  Dr. Brandt Loosen. Discussed that I am unable to seen in controlled substance without an office visit per our office policy. Also discussed that I am not comfortable managing Belbuca or other narcotics as she also takes methylphenidate immediate release TID. Pain management is pending. Patient has current opiate prescribed available. Discussed that she can follow up with Dr. Brandt Loosen if needed until pain management referral is authorized.    Follow Up Instructions: As needed.     I discussed the assessment and treatment plan with the patient. The patient was provided an opportunity to ask questions and all were answered. The patient agreed with the plan and demonstrated an understanding of the instructions.   The patient was advised to call back or seek an in-person evaluation if the symptoms worsen or if the condition fails to improve as anticipated.  The above assessment and management plan was discussed with the patient. The patient verbalized understanding of and has agreed to the management plan. Patient is aware to call the clinic if symptoms persist or worsen. Patient is aware when to return to the clinic for a follow-up visit. Patient educated on when it is appropriate to go to the emergency department.   Time call ended:  1448  I provided 13 minutes of  non face-to-face time during this encounter.    Gwenlyn Perking, FNP

## 2021-07-29 ENCOUNTER — Other Ambulatory Visit: Payer: Self-pay | Admitting: Family Medicine

## 2021-07-29 DIAGNOSIS — M5136 Other intervertebral disc degeneration, lumbar region: Secondary | ICD-10-CM

## 2021-07-29 DIAGNOSIS — Z79891 Long term (current) use of opiate analgesic: Secondary | ICD-10-CM

## 2021-07-29 DIAGNOSIS — M5030 Other cervical disc degeneration, unspecified cervical region: Secondary | ICD-10-CM

## 2021-07-29 DIAGNOSIS — G629 Polyneuropathy, unspecified: Secondary | ICD-10-CM

## 2021-08-01 MED ORDER — GABAPENTIN 300 MG PO CAPS: 300.0000 mg | ORAL_CAPSULE | Freq: Three times a day (TID) | ORAL | 0 refills | Status: AC

## 2021-08-01 MED ORDER — ONDANSETRON HCL 4 MG PO TABS: 4.0000 mg | ORAL_TABLET | Freq: Three times a day (TID) | ORAL | 0 refills | Status: AC | PRN

## 2021-08-01 NOTE — Telephone Encounter (Signed)
Patient wants to know if she can have something for nausea

## 2021-08-01 NOTE — Telephone Encounter (Signed)
Pt says that needs something stronger for back pain. She cannot wait till March for her referral to pain mgt apt. Use [employer]

## 2021-08-01 NOTE — Telephone Encounter (Signed)
I can increase her gabapentin to 300 mg TID. Rx for this has been sent in.

## 2021-08-01 NOTE — Telephone Encounter (Signed)
Zofran ordered.

## 2021-08-01 NOTE — Telephone Encounter (Signed)
I know you won't prescribe her any narcotics but didn't know if there was anything else you wanted to try?

## 2021-08-01 NOTE — Addendum Note (Signed)
Addended by: Gwenlyn Perking on: 08/01/2021 11:32 AM   Modules accepted: Orders

## 2021-08-01 NOTE — Telephone Encounter (Signed)
Patient aware.

## 2021-08-01 NOTE — Addendum Note (Signed)
Addended by: Gwenlyn Perking on: 08/01/2021 04:52 PM   Modules accepted: Orders

## 2021-08-04 NOTE — Telephone Encounter (Signed)
No answer, no voicemail.

## 2021-08-04 NOTE — Telephone Encounter (Signed)
I spoke to pt and advised that Zofran was sent in and pt requested pain meds. I advised pt that Tiffany increased her gabapentin for her pain but that was all Jonelle Sidle would be able to do and pt voiced understanding.

## 2021-08-31 ENCOUNTER — Encounter: Payer: Self-pay | Admitting: *Deleted

## 2021-10-05 ENCOUNTER — Encounter: Payer: Self-pay | Admitting: *Deleted

## 2021-10-14 ENCOUNTER — Ambulatory Visit: Payer: Medicaid Other | Admitting: Nurse Practitioner

## 2021-10-14 ENCOUNTER — Telehealth: Payer: Self-pay | Admitting: Family Medicine

## 2021-10-14 ENCOUNTER — Encounter: Payer: Self-pay | Admitting: Nurse Practitioner

## 2021-10-14 VITALS — BP 136/87 | Temp 98.7°F | Ht 65.0 in | Wt 164.0 lb

## 2021-10-14 DIAGNOSIS — F909 Attention-deficit hyperactivity disorder, unspecified type: Secondary | ICD-10-CM | POA: Insufficient documentation

## 2021-10-14 DIAGNOSIS — R5383 Other fatigue: Secondary | ICD-10-CM | POA: Diagnosis not present

## 2021-10-14 DIAGNOSIS — D126 Benign neoplasm of colon, unspecified: Secondary | ICD-10-CM | POA: Insufficient documentation

## 2021-10-14 DIAGNOSIS — J45909 Unspecified asthma, uncomplicated: Secondary | ICD-10-CM

## 2021-10-14 DIAGNOSIS — R12 Heartburn: Secondary | ICD-10-CM | POA: Insufficient documentation

## 2021-10-14 DIAGNOSIS — R0789 Other chest pain: Secondary | ICD-10-CM

## 2021-10-14 HISTORY — DX: Unspecified asthma, uncomplicated: J45.909

## 2021-10-14 NOTE — Patient Instructions (Signed)
Chest Wall Pain Chest wall pain is pain in or around the bones and muscles of your chest. Chest wall pain may be caused by: An injury. Coughing a lot. Using your chest and arm muscles too much. Sometimes, the cause may not be known. This pain may take a few weeks or longer to get better. Follow these instructions at home: Managing pain, stiffness, and swelling If told, put ice on the painful area: Put ice in a plastic bag. Place a towel between your skin and the bag. Leave the ice on for 20 minutes, 2-3 times a day.  Activity Rest as told by your doctor. Avoid doing things that cause pain. This includes lifting heavy items. Ask your doctor what activities are safe for you. General instructions  Take over-the-counter and prescription medicines only as told by your doctor. Do not use any products that contain nicotine or tobacco, such as cigarettes, e-cigarettes, and chewing tobacco. If you need help quitting, ask your doctor. Keep all follow-up visits as told by your doctor. This is important. Contact a doctor if: You have a fever. Your chest pain gets worse. You have new symptoms. Get help right away if: You feel sick to your stomach (nauseous) or you throw up (vomit). You feel sweaty or light-headed. You have a cough with mucus from your lungs (sputum) or you cough up blood. You are short of breath. These symptoms may be an emergency. Do not wait to see if the symptoms will go away. Get medical help right away. Call your local emergency services (911 in the [place] Do not drive yourself to the hospital. Summary Chest wall pain is pain in or around the bones and muscles of your chest. It may be treated with ice, rest, and medicines. Your condition may also get better if you avoid doing things that cause pain. Contact a doctor if you have a fever, chest pain that gets worse, or new symptoms. Get help right away if you feel light-headed or you get short of breath. These symptoms may  be an emergency. This information is not intended to replace advice given to you by your health care provider. Make sure you discuss any questions you have with your health care provider. Document Revised: 11/16/2020 Document Reviewed: 10/29/2020 Elsevier Patient Education  [place].

## 2021-10-14 NOTE — Telephone Encounter (Signed)
NA

## 2021-10-14 NOTE — Progress Notes (Signed)
Acute Office Visit  Subjective:    Patient ID: Nancy Delacruz, female    DOB: 17-Jul-1961, 61 y.o.   MRN: [id number]  Chief Complaint  Patient presents with   chest discomfort    Vibration feeling around left side of chest    Chest Pain  This is a new problem. The current episode started yesterday. The onset quality is sudden. The problem occurs intermittently. The problem has been gradually improving. Pain location: vibration left upper chest. The patient is experiencing no pain. The pain does not radiate. Pertinent negatives include no abdominal pain, back pain, cough, diaphoresis, dizziness, fever, headaches, lower extremity edema, malaise/fatigue, nausea, near-syncope or numbness. The pain is aggravated by nothing. She has tried nothing for the symptoms. Risk factors include smoking/tobacco exposure.  Her family medical history is significant for heart disease.    Past Medical History:  Diagnosis Date   Allergy    Anemia    Anxiety    Arthritis    Asthma 10/14/2021   Bipolar 1 disorder ([place])    Clotting disorder ([place])    had a blood clot near liver, one time   COPD (chronic obstructive pulmonary disease) ([place])    Depression    Elevated cholesterol 04/04/2021   GERD (gastroesophageal reflux disease)    History of kidney stones    Primary hypertension 07/25/2021    Past Surgical History:  Procedure Laterality Date   BIOPSY  09/12/2016   Procedure: BIOPSY;  Surgeon: Danie Binder, MD;  [facility];  Service: Endoscopy;;  random colon bx's, duodenal bx's, gastric bx's   CHOLECYSTECTOMY  2005   COLONOSCOPY WITH PROPOFOL N/A 09/12/2016   Dr. Oneida Alar: Random colon biopsies negative, external hemorrhoids, two 3-5 mm polyps in the transverse colon removed were tubular adenomas. Next colonoscopy in 5 years   ESOPHAGOGASTRODUODENOSCOPY (EGD) WITH PROPOFOL N/A 09/12/2016   Dr. Oneida Alar: Benign-appearing peptic stricture status post dilation, mild erosive gastritis with benign  biopsies. Small bowel biopsies negative for celiac.   HAND SURGERY     left, tendon   POLYPECTOMY  09/12/2016   Procedure: POLYPECTOMY;  Surgeon: Danie Binder, MD;  [facility];  Service: Endoscopy;;  transverse colon polyps x2   SAVORY DILATION N/A 09/12/2016   Procedure: SAVORY DILATION;  Surgeon: Danie Binder, MD;  [facility];  Service: Endoscopy;  Laterality: N/A;    Family History  Problem Relation Age of Onset   Diabetes Mother    Heart disease Mother 66   Hypertension Mother    Diabetes Father    Alcohol abuse Father    Heart disease Father 63   Hyperlipidemia Father    Hypertension Father    Cancer Brother        ?lung   Cancer Brother        BONES   Colon cancer Neg Hx     Social History   Socioeconomic History   Marital status: Divorced    Spouse name: Not on file   Number of children: 0   Years of education: 12   Highest education level: Not on file  Occupational History   Occupation: unemployed    Comment: "back, copd , nerves"  Tobacco Use   Smoking status: Some Days    Packs/day: 0.25    Types: Cigarettes    Start date: 2008   Smokeless tobacco: Never  Vaping Use   Vaping Use: Never used  Substance and Sexual Activity   Alcohol use:  No   Drug use: No   Sexual activity: Not Currently    Birth control/protection: Post-menopausal  Other Topics Concern   Not on file  Social History Narrative   Homeless   Lives with cousin   Is able to drive   Trying to get disability   Social Determinants of Health   Financial Resource Strain: Low Risk    Difficulty of Paying Living Expenses: Not very hard  Food Insecurity: No Food Insecurity   Worried About Charity fundraiser in the Last Year: Never true   Ran Out of Food in the Last Year: Never true  Transportation Needs: No Transportation Needs   Lack of Transportation (Medical): No   Lack of Transportation (Non-Medical): No  Physical Activity: Inactive   Days of Exercise  per Week: 0 days   Minutes of Exercise per Session: 100 min  Stress: No Stress Concern Present   Feeling of Stress : Not at all  Social Connections: Socially Isolated   Frequency of Communication with Friends and Family: More than three times a week   Frequency of Social Gatherings with Friends and Family: More than three times a week   Attends Religious Services: Never   Marine scientist or Organizations: No   Attends Music therapist: Never   Marital Status: Divorced  Human resources officer Violence: Not At Risk   Fear of Current or Ex-Partner: No   Emotionally Abused: No   Physically Abused: No   Sexually Abused: No    Outpatient Medications Prior to Visit  Medication Sig Dispense Refill   acetaminophen (TYLENOL) 500 MG tablet Take 1 tablet (500 mg total) by mouth every 6 (six) hours as needed. 30 tablet 0   busPIRone (BUSPAR) 10 MG tablet Take 10 mg by mouth 3 (three) times daily.     diclofenac Sodium (VOLTAREN) 1 % GEL Apply 2 g topically 4 (four) times daily. 100 g 0   fluticasone (FLONASE) 50 MCG/ACT nasal spray Place 2 sprays into both nostrils daily. 16 g 6   gabapentin (NEURONTIN) 300 MG capsule Take 1 capsule (300 mg total) by mouth 3 (three) times daily. 270 capsule 0   guaiFENesin (MUCINEX) 600 MG 12 hr tablet Take 1 tablet (600 mg total) by mouth 2 (two) times daily. 30 tablet 0   loperamide (IMODIUM A-D) 2 MG tablet Take 1 tablet (2 mg total) by mouth 4 (four) times daily as needed for diarrhea or loose stools. 30 tablet 0   ondansetron (ZOFRAN) 4 MG tablet Take 1 tablet (4 mg total) by mouth every 8 (eight) hours as needed for nausea or vomiting. 20 tablet 0   BELBUCA 150 MCG FILM Take 1 strip by mouth 2 (two) times daily.     conjugated estrogens (PREMARIN) vaginal cream Use 0.5 gm in vagina at bedtime, every day for 2 weeks then 2-3 x a week 42.5 g 1   methylphenidate (RITALIN) 20 MG tablet Take 20 mg by mouth 3 (three) times daily.     No  facility-administered medications prior to visit.    Allergies  Allergen Reactions   Sulfamethoxazole-Trimethoprim Other (See Comments)    RASH AROUND MOUTH RASH AROUND MOUTH    Aspirin Other (See Comments)   Keflex [Cephalexin] Rash    Review of Systems  Constitutional:  Negative for diaphoresis, fever and malaise/fatigue.  Eyes: Negative.   Respiratory:  Negative for cough.   Cardiovascular:  Positive for chest pain. Negative for near-syncope.  Gastrointestinal:  Negative  for abdominal pain and nausea.  Genitourinary: Negative.   Musculoskeletal:  Negative for back pain.  Neurological:  Negative for dizziness, numbness and headaches.  All other systems reviewed and are negative.     Objective:    Physical Exam Vitals and nursing note reviewed.  Constitutional:      Appearance: Normal appearance.  HENT:     Head: Normocephalic.     Nose: Nose normal.  Eyes:     Conjunctiva/sclera: Conjunctivae normal.  Cardiovascular:     Rate and Rhythm: Normal rate and regular rhythm.     Pulses: Normal pulses. No decreased pulses.     Heart sounds: Normal heart sounds.  Pulmonary:     Effort: Pulmonary effort is normal.     Breath sounds: Normal breath sounds.  Abdominal:     General: Bowel sounds are normal.  Musculoskeletal:     Right lower leg: No edema.     Left lower leg: No edema.  Skin:    General: Skin is warm.     Findings: No rash.  Neurological:     General: No focal deficit present.     Mental Status: She is alert and oriented to person, place, and time.  Psychiatric:        Mood and Affect: Mood normal.        Behavior: Behavior normal.    BP 136/87    Temp 98.7 F (37.1 C)    Ht '5\' 5"'  (1.651 m)    Wt 164 lb (74.4 kg)    SpO2 93%    BMI 27.29 kg/m  Wt Readings from Last 3 Encounters:  10/14/21 164 lb (74.4 kg)  07/25/21 164 lb (74.4 kg)  07/06/21 164 lb (74.4 kg)    There are no preventive care reminders to display for this patient.   There are  no preventive care reminders to display for this patient.   Lab Results  Component Value Date   TSH 0.607 12/08/2020   Lab Results  Component Value Date   WBC 10.2 07/06/2021   HGB 12.5 07/06/2021   HCT 37.7 07/06/2021   MCV 92 07/06/2021   PLT 325 07/06/2021   Lab Results  Component Value Date   NA 143 07/06/2021   K 3.7 07/06/2021   CO2 24 07/06/2021   GLUCOSE 90 07/06/2021   BUN 16 07/06/2021   CREATININE 0.88 07/06/2021   BILITOT 0.2 07/06/2021   ALKPHOS 86 07/06/2021   AST 19 07/06/2021   ALT 13 07/06/2021   PROT 6.9 07/06/2021   ALBUMIN 4.6 07/06/2021   CALCIUM 9.2 07/06/2021   ANIONGAP 9 12/21/2016   EGFR 75 07/06/2021   Lab Results  Component Value Date   CHOL 251 (H) 07/06/2021   Lab Results  Component Value Date   HDL 55 07/06/2021   Lab Results  Component Value Date   LDLCALC 147 (H) 07/06/2021   Lab Results  Component Value Date   TRIG 267 (H) 07/06/2021   Lab Results  Component Value Date   CHOLHDL 4.6 (H) 07/06/2021   No results found for: HGBA1C     Assessment & Plan:  Patient presents with vibrations to left chest wall, symptoms present in the past 24 hours.  Symptoms occurs intermittently.  Patient has no symptoms currently at office visit.  All vital signs are within normal limits.  Patient has no other presenting symptoms of her heart attack.  Completed EKG results pending.  Provided education to patient on signs and symptoms  of a heart attack, patient knows to go to emergency department.  Advised patient to consider smoking cessation.  Patient knows to follow-up with worsening unresolved symptoms. Problem List Items Addressed This Visit   None Visit Diagnoses     Chest discomfort    -  Primary   Relevant Orders   EKG 12-Lead (Completed)   Fatigue, unspecified type       Relevant Orders   CBC with Differential   Vitamin B12        No orders of the defined types were placed in this encounter.    Ivy Lynn, NP

## 2021-10-17 NOTE — Telephone Encounter (Signed)
PT R/C 

## 2021-10-27 NOTE — Telephone Encounter (Signed)
Was never able to reach patient, encounter closed. ?

## 2021-11-14 ENCOUNTER — Ambulatory Visit: Payer: Medicaid Other | Admitting: Family Medicine

## 2021-11-22 ENCOUNTER — Encounter: Payer: Self-pay | Admitting: Family Medicine

## 2021-11-22 ENCOUNTER — Ambulatory Visit: Payer: Medicaid Other | Admitting: Family Medicine

## 2021-12-22 ENCOUNTER — Ambulatory Visit (INDEPENDENT_AMBULATORY_CARE_PROVIDER_SITE_OTHER): Payer: Medicaid Other | Admitting: Psychiatry

## 2021-12-22 ENCOUNTER — Encounter (HOSPITAL_COMMUNITY): Payer: Self-pay | Admitting: Psychiatry

## 2021-12-22 VITALS — BP 140/82 | HR 93 | Temp 98.3°F | Ht 64.5 in | Wt 168.0 lb

## 2021-12-22 DIAGNOSIS — F411 Generalized anxiety disorder: Secondary | ICD-10-CM

## 2021-12-22 DIAGNOSIS — F3181 Bipolar II disorder: Secondary | ICD-10-CM

## 2021-12-22 DIAGNOSIS — F900 Attention-deficit hyperactivity disorder, predominantly inattentive type: Secondary | ICD-10-CM | POA: Diagnosis not present

## 2021-12-22 MED ORDER — BUPROPION HCL ER (SR) 100 MG PO TB12
100.0000 mg | ORAL_TABLET | Freq: Every day | ORAL | 1 refills | Status: AC
Start: 2021-12-22 — End: ?

## 2021-12-22 MED ORDER — LAMOTRIGINE 25 MG PO TABS: 25.0000 mg | ORAL_TABLET | Freq: Every day | ORAL | 0 refills | Status: AC

## 2021-12-22 MED ORDER — BUPROPION HCL ER (SR) 100 MG PO TB12: 100.0000 mg | ORAL_TABLET | Freq: Two times a day (BID) | ORAL | 0 refills | Status: DC
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
## 2022-01-03 ENCOUNTER — Ambulatory Visit: Payer: Medicaid Other | Admitting: Family Medicine

## 2022-01-03 ENCOUNTER — Ambulatory Visit: Payer: Medicaid Other | Admitting: Nurse Practitioner

## 2022-01-26 ENCOUNTER — Ambulatory Visit (HOSPITAL_COMMUNITY): Payer: Medicaid Other | Admitting: Psychiatry

## 2022-04-17 ENCOUNTER — Other Ambulatory Visit (HOSPITAL_COMMUNITY): Payer: Self-pay | Admitting: Adult Health Nurse Practitioner

## 2022-04-17 DIAGNOSIS — Z1231 Encounter for screening mammogram for malignant neoplasm of breast: Secondary | ICD-10-CM

## 2022-04-17 DIAGNOSIS — Z122 Encounter for screening for malignant neoplasm of respiratory organs: Secondary | ICD-10-CM

## 2022-04-17 DIAGNOSIS — F17210 Nicotine dependence, cigarettes, uncomplicated: Secondary | ICD-10-CM

## 2022-05-15 ENCOUNTER — Other Ambulatory Visit: Payer: Self-pay

## 2022-05-15 ENCOUNTER — Emergency Department (HOSPITAL_COMMUNITY): Payer: Medicaid Other

## 2022-05-15 ENCOUNTER — Ambulatory Visit (HOSPITAL_COMMUNITY)
Admission: RE | Admit: 2022-05-15 | Discharge: 2022-05-15 | Disposition: A | Discharge status: Home / Self Care | Payer: Medicaid Other | Source: Ambulatory Visit | Attending: Adult Health Nurse Practitioner | Admitting: Adult Health Nurse Practitioner

## 2022-05-15 ENCOUNTER — Emergency Department (HOSPITAL_COMMUNITY)
Admission: EM | Admit: 2022-05-15 | Discharge: 2022-05-15 | Disposition: A | Discharge status: Home / Self Care | Payer: Medicaid Other | Attending: Emergency Medicine | Admitting: Emergency Medicine

## 2022-05-15 ENCOUNTER — Encounter (HOSPITAL_COMMUNITY): Payer: Self-pay | Admitting: Emergency Medicine

## 2022-05-15 DIAGNOSIS — Z1231 Encounter for screening mammogram for malignant neoplasm of breast: Secondary | ICD-10-CM | POA: Insufficient documentation

## 2022-05-15 DIAGNOSIS — Z122 Encounter for screening for malignant neoplasm of respiratory organs: Secondary | ICD-10-CM

## 2022-05-15 DIAGNOSIS — S300XXA Contusion of lower back and pelvis, initial encounter: Secondary | ICD-10-CM

## 2022-05-15 DIAGNOSIS — S3992XA Unspecified injury of lower back, initial encounter: Secondary | ICD-10-CM | POA: Diagnosis present

## 2022-05-15 DIAGNOSIS — F17210 Nicotine dependence, cigarettes, uncomplicated: Secondary | ICD-10-CM | POA: Diagnosis present

## 2022-05-15 DIAGNOSIS — W07XXXA Fall from chair, initial encounter: Secondary | ICD-10-CM | POA: Insufficient documentation

## 2022-05-15 MED ORDER — NAPROXEN 500 MG PO TABS: 500.0000 mg | ORAL_TABLET | Freq: Two times a day (BID) | ORAL | 0 refills | Status: AC

## 2022-05-15 MED ORDER — KETOROLAC TROMETHAMINE 60 MG/2ML IM SOLN
60.0000 mg | Freq: Once | INTRAMUSCULAR | Status: AC
  Administered 2022-05-15: 60 mg via INTRAMUSCULAR
  Filled 2022-05-15: qty 2

## 2022-05-15 NOTE — ED Triage Notes (Signed)
Pt to the ED after a fall and complains of pain in her tailbone.

## 2022-05-15 NOTE — Discharge Instructions (Signed)
Your testing today did not reveal any broken bones.  You likely just have some bruising and strain, you can take Tylenol or ibuprofen, if you need something stronger you can use the Voltaren gel that you have been taking at home or the medicine which I have prescribed called Naprosyn  Please take Naprosyn, '500mg'$  by mouth twice daily as needed for pain - this in an antiinflammatory medicine (NSAID) and is similar to ibuprofen - many people feel that it is stronger than ibuprofen and it is easier to take since it is a smaller pill.  Please use this only for 1 week - if your pain persists, you will need to follow up with your doctor in the office for ongoing guidance and pain control.

## 2022-05-15 NOTE — ED Provider Notes (Signed)
[facility] EMERGENCY DEPARTMENT Provider Note   CSN: [id number] Arrival date & time: 05/15/22  1456     History  Chief Complaint  Patient presents with   Back Pain   Nancy Delacruz is a 61 y.o. female.   Back Pain Associated symptoms: no fever, no numbness and no weakness   Fall  Patient is a 62 year old female, she states that about 2 weeks ago she had an accidental fall when she missed a chair and fell onto her buttock.  Since that time her pain has been gradually getting better but after running out of gas today while she was walking to the store she had worsening of the pain.  She denies numbness or weakness of the legs no other high risk features, no fevers or chills, no urinary symptoms.     Home Medications Prior to Admission medications   Medication Sig Start Date End Date Taking? Authorizing Provider  naproxen (NAPROSYN) 500 MG tablet Take 1 tablet (500 mg total) by mouth 2 (two) times daily with a meal. 05/15/22  Yes Noemi Chapel, MD  acetaminophen (TYLENOL) 500 MG tablet Take 1 tablet (500 mg total) by mouth every 6 (six) hours as needed. 07/26/21   Ivy Lynn, NP  buPROPion ER (WELLBUTRIN SR) 100 MG 12 hr tablet Take 1 tablet (100 mg total) by mouth daily. Delete prior refill sent 12/22/21   Merian Capron, MD  diclofenac Sodium (VOLTAREN) 1 % GEL Apply 2 g topically 4 (four) times daily. 07/26/21   Ivy Lynn, NP  fluticasone (FLONASE) 50 MCG/ACT nasal spray Place 2 sprays into both nostrils daily. 07/13/21   Ivy Lynn, NP  gabapentin (NEURONTIN) 300 MG capsule Take 1 capsule (300 mg total) by mouth 3 (three) times daily. 08/01/21   Gwenlyn Perking, FNP  guaiFENesin (MUCINEX) 600 MG 12 hr tablet Take 1 tablet (600 mg total) by mouth 2 (two) times daily. 07/13/21   Ivy Lynn, NP  lamoTRIgine (LAMICTAL) 25 MG tablet Take 1 tablet (25 mg total) by mouth daily. 12/22/21   Merian Capron, MD  loperamide (IMODIUM A-D) 2 MG tablet Take 1  tablet (2 mg total) by mouth 4 (four) times daily as needed for diarrhea or loose stools. 07/13/21   Ivy Lynn, NP  ondansetron (ZOFRAN) 4 MG tablet Take 1 tablet (4 mg total) by mouth every 8 (eight) hours as needed for nausea or vomiting. 08/01/21   Gwenlyn Perking, FNP      Allergies    Sulfamethoxazole-trimethoprim, Aspirin, and Keflex [cephalexin]    Review of Systems   Review of Systems  Constitutional:  Negative for fever.  Musculoskeletal:  Positive for back pain.  Neurological:  Negative for weakness and numbness.    Physical Exam Updated Vital Signs Ht 1.651 m ('5\' 5"'$ )   Wt 77.1 kg   BMI 28.29 kg/m  Physical Exam Vitals and nursing note reviewed.  Constitutional:      Appearance: She is well-developed. She is not diaphoretic.  HENT:     Head: Normocephalic and atraumatic.  Eyes:     General:        Right eye: No discharge.        Left eye: No discharge.     Conjunctiva/sclera: Conjunctivae normal.  Pulmonary:     Effort: Pulmonary effort is normal. No respiratory distress.  Musculoskeletal:        General: Tenderness present.     Comments: Mild tenderness over  sacral area, no lumbar tenderness  Skin:    General: Skin is warm and dry.     Findings: No erythema or rash.  Neurological:     Mental Status: She is alert.     Coordination: Coordination normal.     Comments: Able to ambulate and move both legs without deficit, normal strength and sensation bilaterally     ED Results / Procedures / Treatments   Labs (all labs ordered are listed, but only abnormal results are displayed) Labs Reviewed - No data to display  EKG None  Radiology DG Sacrum/Coccyx  Result Date: 05/15/2022 CLINICAL DATA:  Tail bone pain after fall. EXAM: SACRUM AND COCCYX - 2+ VIEW COMPARISON:  October 05, 2016. FINDINGS: There is no evidence of fracture or other focal bone lesions. IMPRESSION: Negative. Electronically Signed   By: Marijo Conception M.D.   On: 05/15/2022 16:19     Procedures Procedures    Medications Ordered in ED Medications  ketorolac (TORADOL) injection 60 mg (has no administration in time range)    ED Course/ Medical Decision Making/ A&P                           Medical Decision Making Amount and/or Complexity of Data Reviewed Radiology: ordered.  Risk Prescription drug management.   This patient presents to the ED for concern of sacral pain differential diagnosis includes fracture, contusion, strain    Additional history obtained:  Additional history obtained from electronic medical record External records from outside source obtained and reviewed including prior visits in the office, multiple visits for sciatica as well as neck pain and bipolar 2 disorder as well   Imaging Studies ordered:  I ordered imaging studies including sacral and coccygeal films I independently visualized and interpreted imaging which showed no acute fractures I agree with the radiologist interpretation   Medicines ordered and prescription drug management:  I ordered medication including ketorolac for pain Reevaluation of the patient after these medicines showed that the patient improved I have reviewed the patients home medicines and have made adjustments as needed   Problem List / ED Course:  Well-appearing Some degree of chronic pain Will prescribe NSAID   Social Determinants of Health:  None           Final Clinical Impression(s) / ED Diagnoses Final diagnoses:  Contusion of sacral region, initial encounter    Rx / DC Orders ED Discharge Orders          Ordered    naproxen (NAPROSYN) 500 MG tablet  2 times daily with meals        05/15/22 1624              Noemi Chapel, MD 05/15/22 1625

## 2022-09-20 ENCOUNTER — Other Ambulatory Visit (HOSPITAL_COMMUNITY): Payer: Self-pay | Admitting: Adult Health Nurse Practitioner

## 2022-09-20 DIAGNOSIS — M25552 Pain in left hip: Secondary | ICD-10-CM

## 2023-07-09 ENCOUNTER — Other Ambulatory Visit: Payer: Self-pay

## 2023-07-09 ENCOUNTER — Ambulatory Visit: Payer: MEDICAID | Attending: Pain Medicine | Admitting: Physical Therapy

## 2023-07-09 ENCOUNTER — Encounter: Payer: Self-pay | Admitting: Physical Therapy

## 2023-07-09 DIAGNOSIS — R600 Localized edema: Secondary | ICD-10-CM | POA: Diagnosis present

## 2023-07-09 DIAGNOSIS — M25561 Pain in right knee: Secondary | ICD-10-CM | POA: Insufficient documentation

## 2023-07-09 NOTE — Therapy (Signed)
OUTPATIENT PHYSICAL THERAPY LOWER EXTREMITY EVALUATION   Patient Name: Nancy Delacruz MRN: [id number] DOB:11/12/1960, 62 y.o., female Today's Date: 07/09/2023  END OF SESSION:  PT End of Session - 07/09/23 1336     Number of Visits 3    Date for PT Re-Evaluation 07/23/23    Activity Tolerance Patient tolerated treatment well    Behavior During Therapy [facility] for tasks assessed/performed             Past Medical History:  Diagnosis Date   Allergy    Anemia    Anxiety    Arthritis    Asthma 10/14/2021   Bipolar 1 disorder (HCC)    Clotting disorder (HCC)    had a blood clot near liver, one time   COPD (chronic obstructive pulmonary disease) (HCC)    Depression    Elevated cholesterol 04/04/2021   GERD (gastroesophageal reflux disease)    History of kidney stones    Primary hypertension 07/25/2021   Past Surgical History:  Procedure Laterality Date   BIOPSY  09/12/2016   Procedure: BIOPSY;  Surgeon: West Bali, MD;  [facility];  Service: Endoscopy;;  random colon bx's, duodenal bx's, gastric bx's   CHOLECYSTECTOMY  2005   COLONOSCOPY WITH PROPOFOL N/A 09/12/2016   Dr. Darrick Penna: Random colon biopsies negative, external hemorrhoids, two 3-5 mm polyps in the transverse colon removed were tubular adenomas. Next colonoscopy in 5 years   ESOPHAGOGASTRODUODENOSCOPY (EGD) WITH PROPOFOL N/A 09/12/2016   Dr. Darrick Penna: Benign-appearing peptic stricture status post dilation, mild erosive gastritis with benign biopsies. Small bowel biopsies negative for celiac.   HAND SURGERY     left, tendon   POLYPECTOMY  09/12/2016   Procedure: POLYPECTOMY;  Surgeon: West Bali, MD;  Location: [facility];  Service: Endoscopy;;  transverse colon polyps x2   SAVORY DILATION N/A 09/12/2016   Procedure: SAVORY DILATION;  Surgeon: West Bali, MD;  Location: [facility];  Service: Endoscopy;  Laterality: N/A;   Patient Active Problem List   Diagnosis Date Noted   Asthma 10/14/2021    Attention deficit hyperactivity disorder 10/14/2021   Benign neoplasm of colon 10/14/2021   Heartburn 10/14/2021   Long term (current) use of opiate analgesic 07/25/2021   DDD (degenerative disc disease), cervical 07/25/2021   DDD (degenerative disc disease), lumbar 07/25/2021   Neuropathy 07/25/2021   Primary hypertension 07/25/2021   Sinus congestion 07/13/2021   Hyperlipidemia 04/04/2021   Bloating 02/09/2021   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   Tick bite of right ear 12/22/2020   Uncontrolled daytime somnolence 12/22/2020   Other fatigue 12/08/2020   Burning sensation of feet 12/08/2020   Vaginal atrophy 11/10/2020   Urethral caruncle 11/10/2020   PMB (postmenopausal bleeding) 11/10/2020   Cervicitis 10/20/2020   Physical exam, annual 09/17/2020   Need for Tdap vaccination 09/17/2020   Lichen sclerosus et atrophicus of the vulva 06/22/2017   Tobacco abuse 03/02/2017   History of pulmonary embolism 03/02/2017   Poor social situation 03/02/2017   Gastritis and gastroduodenitis 01/16/2017   Chronic tension-type headache, not intractable 09/08/2016   Diarrhea 09/04/2016   Dysphagia 09/04/2016   Major depressive disorder, recurrent episode, moderate (HCC) 08/01/2016   Gastroesophageal reflux disease with esophagitis 08/01/2016   Generalized anxiety disorder 08/01/2016   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  THERAPY DIAG:  Acute pain of right knee  Localized edema  Rationale  for Evaluation and Treatment: Rehabilitation  ONSET DATE: ~3 months.  SUBJECTIVE:   SUBJECTIVE STATEMENT: The patient presents tot he clinic with c/o right knee knee that has been getting worse over the last 2-3 months.  She rates her pain at a 7-8/10 today but can become severe with certain activities such as going from sit to stand.  She describes the pain as an ache, sore and throbbing.   Her pain wakes her at night disturbing her sleep.  She states she is here for assessments and to establish a home exercise program.    PERTINENT HISTORY: See above.Bipolar 1.  H/o left hip pain but is not hurting her at this time. PAIN:  Are you having pain? Yes: NPRS scale: 7-8/10 Pain location: Right knee, medial. Pain description: As above. Aggravating factors: Stairs, and getting up from form chair. Relieving factors: Haven't found anything that really helps decrease pain.  PRECAUTIONS: Other: Pain-free (no pain increase therex).    WEIGHT BEARING RESTRICTIONS: No  FALLS:  Has patient fallen in last 6 months? No.  She will use a cane on occasions as it feels like her knee will give way at times.  LIVING ENVIRONMENT: Lives in: House/apartment Has following equipment at home:  None.  OCCUPATION: Disabled.  PLOF: Independent with basic ADLs  PATIENT GOALS: Not have pain.  OBJECTIVE:  Note: Objective measures were completed at Evaluation unless otherwise noted.   EDEMA:  Circumferential: Right knee at apex of patella 2 cms > left.  POSTURE:  Some genu varum noted.  PALPATION: C/o pain over right knee medial joint line.  LOWER EXTREMITY ROM:  Full active right knee flexion and extension.  LOWER EXTREMITY MMT:  Patient able to provide a normal strength grade via manual muscle testing for right hip and knee.  LOWER EXTREMITY SPECIAL TESTS:  Normal right knee stability.  Normal right patellar mobility.   GAIT: She walks slowly and purposefully.  Performed anon-reciprocating stair (4 steps) with one railing.    PATIENT EDUCATION:  Education details: Discussed establishing an HEP that will not increase her knee pain and avoid activities that increase her pain.   Use armrest when going from sit to stand and stand to sit. Person educated: Patient Education method: Explanation Education comprehension: verbalized understanding  HOME EXERCISE  PROGRAM:   ASSESSMENT:  CLINICAL IMPRESSION: The patient presents to [facility] with c/o right knee pain that she has had over the last 2-3 months.  She exhibits full active right knee range of motion and normal right hip and knee strength.  Her knee is stable and she demonstrates normal patellar mobility.  She is tender to palpation over her right knee medial joint line.  Her sleep is disturbed by pain and transitory movements such as sit to stand are painful.  She performs stairs in a non-reciprocating fashion.  Her gait is slow and purposeful.  She has some right knee edema.  Plan will be to establish a HEP for the pain that she can perform without pain increase.  OBJECTIVE IMPAIRMENTS: Abnormal gait, increased edema, and pain.   ACTIVITY LIMITATIONS: sleeping, stairs, and locomotion level  PARTICIPATION LIMITATIONS: meal prep, cleaning, and laundry  REHAB POTENTIAL: Good  CLINICAL DECISION MAKING: Stable/uncomplicated  EVALUATION COMPLEXITY: Low   GOALS:  SHORT TERM GOALS: Target date: 07/23/23.  Ind with a HEP. Baseline:  No knowledge of appropriate therex. Goal status: INITIAL   PLAN:  PT FREQUENCY: 3 visits.  PT DURATION: 2 weeks.  PLANNED INTERVENTIONS: 97110-Therapeutic exercises,  40981- Therapeutic activity, [id number]- Neuromuscular re-education, [phone]- Self Care, 82956- Electrical stimulation (unattended), Patient/Family education, Cryotherapy, and Moist heat  PLAN FOR NEXT SESSION: Recumbent bike, pain-free (no pain increase) right knee therex (O and CKC).     Jan Walters, [place], PT 07/09/2023, 2:22 PM

## 2023-07-12 ENCOUNTER — Ambulatory Visit: Payer: MEDICAID

## 2023-07-12 DIAGNOSIS — R600 Localized edema: Secondary | ICD-10-CM

## 2023-07-12 DIAGNOSIS — M25561 Pain in right knee: Secondary | ICD-10-CM | POA: Diagnosis not present

## 2023-07-12 NOTE — Therapy (Addendum)
OUTPATIENT PHYSICAL THERAPY LOWER EXTREMITY TREATMENT   Patient Name: Nancy Delacruz MRN: [id number] DOB:[birth year], 62 y.o., female Today's Date: 07/12/2023  END OF SESSION:  PT End of Session - 07/12/23 0824     Visit Number 2    Number of Visits 3    Date for PT Re-Evaluation 07/23/23    PT Start Time 0823    PT Stop Time 0838    PT Time Calculation (min) 15 min    Activity Tolerance Patient tolerated treatment well    Behavior During Therapy [facility] for tasks assessed/performed             Past Medical History:  Diagnosis Date   Allergy    Anemia    Anxiety    Arthritis    Asthma 10/14/2021   Bipolar 1 disorder (HCC)    Clotting disorder (HCC)    had a blood clot near liver, one time   COPD (chronic obstructive pulmonary disease) (HCC)    Depression    Elevated cholesterol 04/04/2021   GERD (gastroesophageal reflux disease)    History of kidney stones    Primary hypertension 07/25/2021   Past Surgical History:  Procedure Laterality Date   BIOPSY  09/12/2016   Procedure: BIOPSY;  Surgeon: West Bali, MD;  [facility];  Service: Endoscopy;;  random colon bx's, duodenal bx's, gastric bx's   CHOLECYSTECTOMY  2005   COLONOSCOPY WITH PROPOFOL N/A 09/12/2016   Dr. Darrick Penna: Random colon biopsies negative, external hemorrhoids, two 3-5 mm polyps in the transverse colon removed were tubular adenomas. Next colonoscopy in 5 years   ESOPHAGOGASTRODUODENOSCOPY (EGD) WITH PROPOFOL N/A 09/12/2016   Dr. Darrick Penna: Benign-appearing peptic stricture status post dilation, mild erosive gastritis with benign biopsies. Small bowel biopsies negative for celiac.   HAND SURGERY     left, tendon   POLYPECTOMY  09/12/2016   Procedure: POLYPECTOMY;  Surgeon: West Bali, MD;  [facility];  Service: Endoscopy;;  transverse colon polyps x2   SAVORY DILATION N/A 09/12/2016   Procedure: SAVORY DILATION;  Surgeon: West Bali, MD;  [facility];  Service:  Endoscopy;  Laterality: N/A;   Patient Active Problem List   Diagnosis Date Noted   Asthma 10/14/2021   Attention deficit hyperactivity disorder 10/14/2021   Benign neoplasm of colon 10/14/2021   Heartburn 10/14/2021   Long term (current) use of opiate analgesic 07/25/2021   DDD (degenerative disc disease), cervical 07/25/2021   DDD (degenerative disc disease), lumbar 07/25/2021   Neuropathy 07/25/2021   Primary hypertension 07/25/2021   Sinus congestion 07/13/2021   Hyperlipidemia 04/04/2021   Bloating 02/09/2021   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   Tick bite of right ear 12/22/2020   Uncontrolled daytime somnolence 12/22/2020   Other fatigue 12/08/2020   Burning sensation of feet 12/08/2020   Vaginal atrophy 11/10/2020   Urethral caruncle 11/10/2020   PMB (postmenopausal bleeding) 11/10/2020   Cervicitis 10/20/2020   Physical exam, annual 09/17/2020   Need for Tdap vaccination 09/17/2020   Lichen sclerosus et atrophicus of the vulva 06/22/2017   Tobacco abuse 03/02/2017   History of pulmonary embolism 03/02/2017   Poor social situation 03/02/2017   Gastritis and gastroduodenitis 01/16/2017   Chronic tension-type headache, not intractable 09/08/2016   Diarrhea 09/04/2016   Dysphagia 09/04/2016   Major depressive disorder, recurrent episode, moderate (HCC) 08/01/2016   Gastroesophageal reflux disease with esophagitis 08/01/2016   Generalized anxiety disorder 08/01/2016   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  THERAPY DIAG:  Acute pain of right knee  Localized edema  Rationale for Evaluation and Treatment: Rehabilitation  ONSET DATE: ~3 months.  SUBJECTIVE:   SUBJECTIVE STATEMENT: Pt reports 10/10 right knee pain today.   PERTINENT HISTORY: See above.Bipolar 1.  H/o left hip pain but is not hurting her at this time. PAIN:  Are you having pain? Yes: NPRS  scale: 10/10 Pain location: Right knee, medial. Pain description: As above. Aggravating factors: Stairs, and getting up from form chair. Relieving factors: Haven't found anything that really helps decrease pain.  PRECAUTIONS: Other: Pain-free (no pain increase therex).    WEIGHT BEARING RESTRICTIONS: No  FALLS:  Has patient fallen in last 6 months? No.  She will use a cane on occasions as it feels like her knee will give way at times.  LIVING ENVIRONMENT: Lives in: House/apartment Has following equipment at home:  None.  OCCUPATION: Disabled.  PLOF: Independent with basic ADLs  PATIENT GOALS: Not have pain.  OBJECTIVE:  Note: Objective measures were completed at Evaluation unless otherwise noted.   EDEMA:  Circumferential: Right knee at apex of patella 2 cms > left.  POSTURE:  Some genu varum noted.  PALPATION: C/o pain over right knee medial joint line.  LOWER EXTREMITY ROM:  Full active right knee flexion and extension.  LOWER EXTREMITY MMT:  Patient able to provide a normal strength grade via manual muscle testing for right hip and knee.  LOWER EXTREMITY SPECIAL TESTS:  Normal right knee stability.  Normal right patellar mobility.   GAIT: She walks slowly and purposefully.  Performed anon-reciprocating stair (4 steps) with one railing.    PATIENT EDUCATION:  Education details: Discussed establishing an HEP that will not increase her knee pain and avoid activities that increase her pain.   Use armrest when going from sit to stand and stand to sit. Person educated: Patient Education method: Explanation Education comprehension: verbalized understanding  HOME EXERCISE PROGRAM: [email or web address]  Access Code: [email or web address]  ASSESSMENT:  CLINICAL IMPRESSION: Pt arrives for today's treatment session reporting 10/10 right knee pain.  Pt declines performance of exercises due to max knee pain.  Pt requests HEP for home use.  Pt walked through  each exercise added to HEP with pt able to demonstrate and verbalize understanding of each exercise.  All questions encouraged and answered.  Pt instructed to call the facility with any questions or concerns.  Pt ready for discharge at this time.   OBJECTIVE IMPAIRMENTS: Abnormal gait, increased edema, and pain.   ACTIVITY LIMITATIONS: sleeping, stairs, and locomotion level  PARTICIPATION LIMITATIONS: meal prep, cleaning, and laundry  REHAB POTENTIAL: Good  CLINICAL DECISION MAKING: Stable/uncomplicated  EVALUATION COMPLEXITY: Low   GOALS:  SHORT TERM GOALS: Target date: 07/23/23.  Ind with a HEP. Baseline:  No knowledge of appropriate therex. Goal status: MET   PLAN:  PT FREQUENCY: 3 visits.  PT DURATION: 2 weeks.  PLANNED INTERVENTIONS: 97110-Therapeutic exercises, 97530- Therapeutic activity, [id number]- Neuromuscular re-education, 97535- Self Care, [id number]- Electrical stimulation (unattended), Patient/Family education, Cryotherapy, and Moist heat  PLAN FOR NEXT SESSION: Recumbent bike, pain-free (no pain increase) right knee therex (O and CKC).     Newman Pies, PTA 07/12/2023, 8:41 AM
